# Patient Record
Sex: Male | Born: 1950 | Race: White | Hispanic: No | Marital: Single | Smoking: Former smoker
Health system: Southern US, Community
[De-identification: ages and names within clinical notes are randomized; demographics above are authoritative.]

## PROBLEM LIST (undated history)

## (undated) DIAGNOSIS — T8859XA Other complications of anesthesia, initial encounter: Secondary | ICD-10-CM

## (undated) DIAGNOSIS — I499 Cardiac arrhythmia, unspecified: Secondary | ICD-10-CM

## (undated) DIAGNOSIS — T4145XA Adverse effect of unspecified anesthetic, initial encounter: Secondary | ICD-10-CM

## (undated) DIAGNOSIS — J449 Chronic obstructive pulmonary disease, unspecified: Secondary | ICD-10-CM

## (undated) DIAGNOSIS — Z9889 Other specified postprocedural states: Secondary | ICD-10-CM

## (undated) DIAGNOSIS — M1990 Unspecified osteoarthritis, unspecified site: Secondary | ICD-10-CM

## (undated) DIAGNOSIS — R112 Nausea with vomiting, unspecified: Secondary | ICD-10-CM

## (undated) DIAGNOSIS — I10 Essential (primary) hypertension: Secondary | ICD-10-CM

## (undated) HISTORY — PX: FOOT SURGERY: SHX648

## (undated) HISTORY — PX: HERNIA REPAIR: SHX51

---
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
## 2018-03-09 ENCOUNTER — Other Ambulatory Visit: Payer: Self-pay | Admitting: Orthopaedic Surgery

## 2018-04-25 ENCOUNTER — Inpatient Hospital Stay: Admit: 2018-04-25 | Payer: Self-pay | Admitting: Orthopaedic Surgery

## 2018-04-25 SURGERY — ARTHROPLASTY, HIP, TOTAL, ANTERIOR APPROACH
Anesthesia: Spinal | Laterality: Right

## 2018-05-29 ENCOUNTER — Other Ambulatory Visit: Payer: Self-pay | Admitting: Orthopaedic Surgery

## 2018-06-06 NOTE — Patient Instructions (Signed)
Jason Mosley  06/06/2018   Your procedure is scheduled on: 06-13-18    Report to [facility] Main  Entrance    Report to Short Stay at 5:30 AM   YOU NEED TO HAVE A COVID 19 TEST ON 06-09-18, THIS TEST MUST BE DONE BEFORE SURGERY, COME TO [facility] ENTRANCE BETWEEN THE HOURS OF 900 AM AND 300 PM ON YOUR COVID TEST DATE.   Call this number if you have problems the morning of surgery [phone]    Remember: NO SOLID FOOD AFTER MIDNIGHT THE NIGHT PRIOR TO SURGERY. NOTHING BY MOUTH EXCEPT CLEAR LIQUIDS UNTIL 3 HOURS PRIOR TO SCHEDULED SURGERY. PLEASE FINISH ENSURE DRINK PER SURGEON ORDER 3 HOURS PRIOR TO SCHEDULED SURGERY TIME WHICH NEEDS TO BE COMPLETED AT 4:30 AM.   CLEAR LIQUID DIET   Foods Allowed                                                                     Foods Excluded  Coffee and tea, regular and decaf                             liquids that you cannot  Plain Jell-O in any flavor                                             see through such as: Fruit ices (not with fruit pulp)                                     milk, soups, orange juice  Iced Popsicles                                    All solid food Carbonated beverages, regular and diet                                    Cranberry, grape and apple juices Sports drinks like Gatorade Lightly seasoned clear broth or consume(fat free) Sugar, honey syrup  Sample Menu Breakfast                                Lunch                                     Supper Cranberry juice                    Beef broth                            Chicken broth Jell-O  Grape juice                           Apple juice Coffee or tea                        Jell-O                                      Popsicle                                                Coffee or tea                        Coffee or  tea  _____________________________________________________________________     BRUSH YOUR TEETH MORNING OF SURGERY AND RINSE YOUR MOUTH OUT, NO CHEWING GUM CANDY OR MINTS.     Take these medicines the morning of surgery with A SIP OF WATER: None                                  You may not have any metal on your body including hair pins and              piercings     Do not wear jewelry, make-up, lotions, powders or perfumes, deodorant                          Men may shave face and neck.   Do not bring valuables to the hospital. [place] IS NOT             RESPONSIBLE   FOR VALUABLES.  Contacts, dentures or bridgework may not be worn into surgery.    Special Instructions: N/A              Please read over the following fact sheets you were given: _____________________________________________________________________             [facility] - Preparing for [facility] Before surgery, you can play an important role.  Because skin is not sterile, your skin needs to be as free of germs as possible.  You can reduce the number of germs on your skin by washing with CHG (chlorahexidine gluconate) soap before surgery.  CHG is an antiseptic cleaner which kills germs and bonds with the skin to continue killing germs even after washing. Please DO NOT use if you have an allergy to CHG or antibacterial soaps.  If your skin becomes reddened/irritated stop using the CHG and inform your nurse when you arrive at Short Stay. Do not shave (including legs and underarms) for at least 48 hours prior to the first CHG shower.  You may shave your face/neck. Please follow these instructions carefully:  1.  Shower with CHG Soap the night before surgery and the  morning of Surgery.  2.  If you choose to wash your hair, wash your hair first as usual with your  normal  shampoo.  3.  After you shampoo, rinse your hair and body thoroughly to remove the  shampoo.  4.  Use CHG as you would  any other liquid soap.  You can apply chg directly  to the skin and wash                       Gently with a scrungie or clean washcloth.  5.  Apply the CHG Soap to your body ONLY FROM THE NECK DOWN.   Do not use on face/ open                           Wound or open sores. Avoid contact with eyes, ears mouth and genitals (private parts).                       Wash face,  Genitals (private parts) with your normal soap.             6.  Wash thoroughly, paying special attention to the area where your surgery  will be performed.  7.  Thoroughly rinse your body with warm water from the neck down.  8.  DO NOT shower/wash with your normal soap after using and rinsing off  the CHG Soap.                9.  Pat yourself dry with a clean towel.            10.  Wear clean pajamas.            11.  Place clean sheets on your bed the night of your first shower and do not  sleep with pets. Day of Surgery : Do not apply any lotions/deodorants the morning of surgery.  Please wear clean clothes to the hospital/surgery center.  FAILURE TO FOLLOW THESE INSTRUCTIONS MAY RESULT IN THE CANCELLATION OF YOUR SURGERY PATIENT SIGNATURE_________________________________  NURSE SIGNATURE__________________________________  ________________________________________________________________________   Adam Phenix  An incentive spirometer is a tool that can help keep your lungs clear and active. This tool measures how well you are filling your lungs with each breath. Taking long deep breaths may help reverse or decrease the chance of developing breathing (pulmonary) problems (especially infection) following:  A long period of time when you are unable to move or be active. BEFORE THE PROCEDURE   If the spirometer includes an indicator to show your best effort, your nurse or respiratory therapist will set it to a desired goal.  If possible, sit up straight or lean slightly forward. Try not to slouch.  Hold the  incentive spirometer in an upright position. INSTRUCTIONS FOR USE  1. Sit on the edge of your bed if possible, or sit up as far as you can in bed or on a chair. 2. Hold the incentive spirometer in an upright position. 3. Breathe out normally. 4. Place the mouthpiece in your mouth and seal your lips tightly around it. 5. Breathe in slowly and as deeply as possible, raising the piston or the ball toward the top of the column. 6. Hold your breath for 3-5 seconds or for as long as possible. Allow the piston or ball to fall to the bottom of the column. 7. Remove the mouthpiece from your mouth and breathe out normally. 8. Rest for a few seconds and repeat Steps 1 through 7 at least 10 times every 1-2 hours when you are awake. Take your time and take a few normal breaths between deep breaths. 9. The spirometer may include an indicator to  show your best effort. Use the indicator as a goal to work toward during each repetition. 10. After each set of 10 deep breaths, practice coughing to be sure your lungs are clear. If you have an incision (the cut made at the time of surgery), support your incision when coughing by placing a pillow or rolled up towels firmly against it. Once you are able to get out of bed, walk around indoors and cough well. You may stop using the incentive spirometer when instructed by your caregiver.  RISKS AND COMPLICATIONS  Take your time so you do not get dizzy or light-headed.  If you are in pain, you may need to take or ask for pain medication before doing incentive spirometry. It is harder to take a deep breath if you are having pain. AFTER USE  Rest and breathe slowly and easily.  It can be helpful to keep track of a log of your progress. Your caregiver can provide you with a simple table to help with this. If you are using the spirometer at home, follow these instructions: [place] IF:   You are having difficultly using the spirometer.  You have trouble using  the spirometer as often as instructed.  Your pain medication is not giving enough relief while using the spirometer.  You develop fever of 100.5 F (38.1 C) or higher. SEEK IMMEDIATE MEDICAL CARE IF:   You cough up bloody sputum that had not been present before.  You develop fever of 102 F (38.9 C) or greater.  You develop worsening pain at or near the incision site. MAKE SURE YOU:   Understand these instructions.  Will watch your condition.  Will get help right away if you are not doing well or get worse. Document Released: 05/10/2006 Document Revised: 03/22/2011 Document Reviewed: 07/11/2006 Exit[employer] Patient Information 2014 ExitCare, [place].   ________________________________________________________________________  WHAT IS A BLOOD TRANSFUSION? Blood Transfusion Information  A transfusion is the replacement of blood or some of its parts. Blood is made up of multiple cells which provide different functions.  Red blood cells carry oxygen and are used for blood loss replacement.  White blood cells fight against infection.  Platelets control bleeding.  Plasma helps clot blood.  Other blood products are available for specialized needs, such as hemophilia or other clotting disorders. BEFORE THE TRANSFUSION  Who gives blood for transfusions?   Healthy volunteers who are fully evaluated to make sure their blood is safe. This is blood bank blood. Transfusion therapy is the safest it has ever been in the practice of medicine. Before blood is taken from a donor, a complete history is taken to make sure that person has no history of diseases nor engages in risky social behavior (examples are intravenous drug use or sexual activity with multiple partners). The donor's travel history is screened to minimize risk of transmitting infections, such as malaria. The donated blood is tested for signs of infectious diseases, such as HIV and hepatitis. The blood is then tested to be sure it  is compatible with you in order to minimize the chance of a transfusion reaction. If you or a relative donates blood, this is often done in anticipation of surgery and is not appropriate for emergency situations. It takes many days to process the donated blood. RISKS AND COMPLICATIONS Although transfusion therapy is very safe and saves many lives, the main dangers of transfusion include:   Getting an infectious disease.  Developing a transfusion reaction. This is an allergic reaction  to something in the blood you were given. Every precaution is taken to prevent this. The decision to have a blood transfusion has been considered carefully by your caregiver before blood is given. Blood is not given unless the benefits outweigh the risks. AFTER THE TRANSFUSION  Right after receiving a blood transfusion, you will usually feel much better and more energetic. This is especially true if your red blood cells have gotten low (anemic). The transfusion raises the level of the red blood cells which carry oxygen, and this usually causes an energy increase.  The nurse administering the transfusion will monitor you carefully for complications. HOME CARE INSTRUCTIONS  No special instructions are needed after a transfusion. You may find your energy is better. Speak with your caregiver about any limitations on activity for underlying diseases you may have. SEEK MEDICAL CARE IF:   Your condition is not improving after your transfusion.  You develop redness or irritation at the intravenous (IV) site. SEEK IMMEDIATE MEDICAL CARE IF:  Any of the following symptoms occur over the next 12 hours:  Shaking chills.  You have a temperature by mouth above 102 F (38.9 C), not controlled by medicine.  Chest, back, or muscle pain.  People around you feel you are not acting correctly or are confused.  Shortness of breath or difficulty breathing.  Dizziness and fainting.  You get a rash or develop hives.  You  have a decrease in urine output.  Your urine turns a dark color or changes to pink, red, or brown. Any of the following symptoms occur over the next 10 days:  You have a temperature by mouth above 102 F (38.9 C), not controlled by medicine.  Shortness of breath.  Weakness after normal activity.  The white part of the eye turns yellow (jaundice).  You have a decrease in the amount of urine or are urinating less often.  Your urine turns a dark color or changes to pink, red, or brown. Document Released: 12/26/1999 Document Revised: 03/22/2011 Document Reviewed: 08/14/2007 [facility] Patient Information 2014 [place], [place].  _______________________________________________________________________

## 2018-06-07 ENCOUNTER — Other Ambulatory Visit: Payer: Self-pay

## 2018-06-07 ENCOUNTER — Other Ambulatory Visit: Payer: Self-pay | Admitting: Orthopaedic Surgery

## 2018-06-07 ENCOUNTER — Encounter (HOSPITAL_COMMUNITY): Payer: Self-pay

## 2018-06-07 ENCOUNTER — Encounter (HOSPITAL_COMMUNITY)
Admission: RE | Admit: 2018-06-07 | Discharge: 2018-06-07 | Disposition: A | Discharge status: Home / Self Care | Payer: Medicare Other | Source: Ambulatory Visit | Attending: Orthopaedic Surgery | Admitting: Orthopaedic Surgery

## 2018-06-07 ENCOUNTER — Ambulatory Visit (HOSPITAL_COMMUNITY)
Admission: RE | Admit: 2018-06-07 | Discharge: 2018-06-07 | Disposition: A | Discharge status: Home / Self Care | Payer: Medicare Other | Source: Ambulatory Visit | Attending: Orthopaedic Surgery | Admitting: Orthopaedic Surgery

## 2018-06-07 DIAGNOSIS — Z01818 Encounter for other preprocedural examination: Secondary | ICD-10-CM | POA: Insufficient documentation

## 2018-06-07 HISTORY — DX: Chronic obstructive pulmonary disease, unspecified: J44.9

## 2018-06-07 HISTORY — DX: Nausea with vomiting, unspecified: R11.2

## 2018-06-07 HISTORY — DX: Other specified postprocedural states: Z98.890

## 2018-06-07 HISTORY — DX: Essential (primary) hypertension: I10

## 2018-06-07 HISTORY — DX: Other complications of anesthesia, initial encounter: T88.59XA

## 2018-06-07 HISTORY — DX: Unspecified osteoarthritis, unspecified site: M19.90

## 2018-06-07 LAB — URINALYSIS, ROUTINE W REFLEX MICROSCOPIC
Bilirubin Urine: NEGATIVE
Glucose, UA: NEGATIVE mg/dL
Hgb urine dipstick: NEGATIVE
Ketones, ur: NEGATIVE mg/dL
Nitrite: NEGATIVE
Protein, ur: NEGATIVE mg/dL
Specific Gravity, Urine: 1.009 (ref 1.005–1.030)
pH: 7 (ref 5.0–8.0)

## 2018-06-07 LAB — BASIC METABOLIC PANEL
Anion gap: 10 (ref 5–15)
BUN: 16 mg/dL (ref 8–23)
CO2: 26 mmol/L (ref 22–32)
Calcium: 9.4 mg/dL (ref 8.9–10.3)
Chloride: 100 mmol/L (ref 98–111)
Creatinine, Ser: 1.08 mg/dL (ref 0.61–1.24)
GFR calc Af Amer: >60 mL/min (ref >60)
GFR calc non Af Amer: >60 mL/min (ref >60)
Glucose, Bld: 102 mg/dL — ABNORMAL HIGH (ref 70–99)
Potassium: 4.4 mmol/L (ref 3.5–5.1)
Sodium: 136 mmol/L (ref 135–145)

## 2018-06-07 LAB — CBC WITH DIFFERENTIAL/PLATELET
Abs Immature Granulocytes: 0.02 10*3/uL (ref 0.00–0.07)
Basophils Absolute: 0 10*3/uL (ref 0.0–0.1)
Basophils Relative: 1 %
Eosinophils Absolute: 0.1 10*3/uL (ref 0.0–0.5)
Eosinophils Relative: 2 %
HCT: 44.2 % (ref 39.0–52.0)
Hemoglobin: 14.7 g/dL (ref 13.0–17.0)
Immature Granulocytes: 0 %
Lymphocytes Relative: 25 %
Lymphs Abs: 1.5 10*3/uL (ref 0.7–4.0)
MCH: 32.3 pg (ref 26.0–34.0)
MCHC: 33.3 g/dL (ref 30.0–36.0)
MCV: 97.1 fL (ref 80.0–100.0)
Monocytes Absolute: 0.5 10*3/uL (ref 0.1–1.0)
Monocytes Relative: 8 %
Neutro Abs: 3.9 10*3/uL (ref 1.7–7.7)
Neutrophils Relative %: 64 %
Platelets: 239 10*3/uL (ref 150–400)
RBC: 4.55 MIL/uL (ref 4.22–5.81)
RDW: 12.2 % (ref 11.5–15.5)
WBC: 6.1 10*3/uL (ref 4.0–10.5)
nRBC: 0 % (ref 0.0–0.2)

## 2018-06-07 LAB — PROTIME-INR
INR: 1 (ref 0.8–1.2)
Prothrombin Time: 13.3 seconds (ref 11.4–15.2)

## 2018-06-07 LAB — ABO/RH: ABO/RH(D): O POS

## 2018-06-07 LAB — APTT: aPTT: 28 seconds (ref 24–36)

## 2018-06-07 NOTE — Care Plan (Signed)
Spoke with patient prior to surgery. He plans to discharge to home with friend to assist. Rolling walker has been ordered for him for delivery to hospital room prior to discharge. He will go to [facility] in [place] for therapy . Patient and MD in agreement with plan. Choice offered.    Shauna Hugh, RNCM  [phone]

## 2018-06-09 ENCOUNTER — Other Ambulatory Visit (HOSPITAL_COMMUNITY)
Admission: RE | Admit: 2018-06-09 | Discharge: 2018-06-09 | Disposition: A | Discharge status: Home / Self Care | Payer: Medicare Other | Source: Ambulatory Visit | Attending: Orthopaedic Surgery | Admitting: Orthopaedic Surgery

## 2018-06-09 DIAGNOSIS — Z1159 Encounter for screening for other viral diseases: Secondary | ICD-10-CM | POA: Insufficient documentation

## 2018-06-10 LAB — NOVEL CORONAVIRUS, NAA (HOSP ORDER, SEND-OUT TO REF LAB; TAT 18-24 HRS): SARS-CoV-2, NAA: NOT DETECTED

## 2018-06-12 MED ORDER — BUPIVACAINE LIPOSOME 1.3 % IJ SUSP
10.0000 mL | Freq: Once | INTRAMUSCULAR | Status: DC
  Filled 2018-06-12: qty 10

## 2018-06-12 MED ORDER — TRANEXAMIC ACID 1000 MG/10ML IV SOLN
2000.0000 mg | INTRAVENOUS | Status: DC
  Filled 2018-06-12: qty 20

## 2018-06-12 NOTE — H&P (Signed)
TOTAL HIP ADMISSION H&P  Patient is admitted for right total hip arthroplasty.  Subjective:  Chief Complaint: right hip pain  HPI: Jason Mosley, 68 y.o. male, has a history of pain and functional disability in the right hip(s) due to arthritis and patient has failed non-surgical conservative treatments for greater than 12 weeks to include NSAID's and/or analgesics, flexibility and strengthening excercises, supervised PT with diminished ADL's post treatment, use of assistive devices, weight reduction as appropriate and activity modification.  Onset of symptoms was gradual starting 5 years ago with gradually worsening course since that time.The patient noted no past surgery on the right hip(s).  Patient currently rates pain in the right hip at 10 out of 10 with activity. Patient has night pain, worsening of pain with activity and weight bearing, trendelenberg gait, pain that interfers with activities of daily living and crepitus. Patient has evidence of subchondral cysts, subchondral sclerosis, periarticular osteophytes and joint space narrowing by imaging studies. This condition presents safety issues increasing the risk of falls. There is no current active infection.  There are no active problems to display for this patient.  Past Medical History:  Diagnosis Date  . Arthritis   . Complication of anesthesia   . COPD (chronic obstructive pulmonary disease) (HCC)    Diagnosised x 20 years ago. No complications since diagnosed  . Hypertension   . PONV (postoperative nausea and vomiting)     Past Surgical History:  Procedure Laterality Date  . FOOT SURGERY    . HERNIA REPAIR     Inguinal    Current Facility-Administered Medications  Medication Dose Route Frequency Provider Last Rate Last Dose  . [START ON 06/13/2018] bupivacaine liposome (EXPAREL) 1.3 % injection 133 mg  10 mL Infiltration Once Marcene Corningalldorf, Peter, MD      . Melene Muller[START ON 06/13/2018] tranexamic acid (CYKLOKAPRON) 2,000 mg in sodium  chloride 0.9 % 50 mL Topical Application  2,000 mg Topical To OR Marcene Corningalldorf, Peter, MD       Current Outpatient Medications  Medication Sig Dispense Refill Last Dose  . acetaminophen (TYLENOL) 650 MG CR tablet Take 1,300 mg by mouth every 8 (eight) hours as needed for pain.     Marland Kitchen. atorvastatin (LIPITOR) 10 MG tablet Take 10 mg by mouth every evening.     . Cyanocobalamin (B-12 PO) Take 1 tablet by mouth daily.     . diclofenac sodium (VOLTAREN) 1 % GEL Apply 1 application topically 3 (three) times daily.     . diphenhydrAMINE (BENADRYL) 25 MG tablet Take 25 mg by mouth at bedtime as needed for sleep.     . finasteride (PROSCAR) 5 MG tablet Take 5 mg by mouth every evening.     Marland Kitchen. lisinopril (ZESTRIL) 10 MG tablet Take 10 mg by mouth every evening.     . Multiple Vitamin (MULTIVITAMIN WITH MINERALS) TABS tablet Take 1 tablet by mouth daily.     . tamsulosin (FLOMAX) 0.4 MG CAPS capsule Take 0.4 mg by mouth every evening.      Allergies  Allergen Reactions  . Penicillins Itching    Did it involve swelling of the face/tongue/throat, SOB, or low BP? No Did it involve sudden or severe rash/hives, skin peeling, or any reaction on the inside of your mouth or nose? No Did you need to seek medical attention at a hospital or doctor's office? No When did it last happen?10 years ago If all above answers are "NO", may proceed with cephalosporin use.     Social  History   Tobacco Use  . Smoking status: Former Smoker    Packs/day: 3.00    Years: 50.00    Pack years: 150.00    Types: Cigarettes  . Smokeless tobacco: Never Used  . Tobacco comment: closet smoker  Substance Use Topics  . Alcohol use: Yes    Alcohol/week: 6.0 standard drinks    Types: 6 Glasses of wine per week    Comment: Fridays /Saturday weekly    No family history on file.   Review of Systems  Musculoskeletal: Positive for joint pain.       Right hip  All other systems reviewed and are  negative.   Objective:  Physical Exam  Constitutional: He is oriented to person, place, and time. He appears well-developed and well-nourished.  HENT:  Head: Normocephalic and atraumatic.  Eyes: Pupils are equal, round, and reactive to light.  Neck: Normal range of motion.  Cardiovascular: Normal rate and regular rhythm.  Respiratory: Effort normal.  GI: Soft.  Musculoskeletal:     Comments: He walks with a very antalgic gait.  Right hip motion is extremely limited and very painful.  Straight leg raise is negative.  Motion of both knees is about 0-120.  There is crepitation and trace effusion on the left.  Sensation and motor function are intact in his feet with palpable pulses on both sides.    Neurological: He is alert and oriented to person, place, and time.  Skin: Skin is warm and dry.  Psychiatric: He has a normal mood and affect. His behavior is normal. Judgment and thought content normal.    Vital signs in last 24 hours:    Labs:   Estimated body mass index is 29.91 kg/m as calculated from the following:   Height as of 06/07/18: 5\' 10"  (1.778 m).   Weight as of 06/07/18: 94.5 kg.   Imaging Review Plain radiographs demonstrate severe degenerative joint disease of the right hip(s). The bone quality appears to be good for age and reported activity level.      Assessment/Plan:  End stage primary arthritis, right hip(s)  The patient history, physical examination, clinical judgement of the provider and imaging studies are consistent with end stage degenerative joint disease of the right hip(s) and total hip arthroplasty is deemed medically necessary. The treatment options including medical management, injection therapy, arthroscopy and arthroplasty were discussed at length. The risks and benefits of total hip arthroplasty were presented and reviewed. The risks due to aseptic loosening, infection, stiffness, dislocation/subluxation,  thromboembolic complications and other  imponderables were discussed.  The patient acknowledged the explanation, agreed to proceed with the plan and consent was signed. Patient is being admitted for inpatient treatment for surgery, pain control, PT, OT, prophylactic antibiotics, VTE prophylaxis, progressive ambulation and ADL's and discharge planning.The patient is planning to be discharged home with home health services

## 2018-06-12 NOTE — Anesthesia Preprocedure Evaluation (Addendum)
Anesthesia Evaluation  Patient identified by MRN, date of birth, ID band Patient awake    Reviewed: Allergy & Precautions, NPO status , Patient's Chart, lab work & pertinent test results  History of Anesthesia Complications (+) PONV and history of anesthetic complications  Airway Mallampati: II  TM Distance: >3 FB Neck ROM: Full    Dental  (+) Dental Advisory Given, Teeth Intact   Pulmonary sleep apnea , COPD, former smoker,    breath sounds clear to auscultation       Cardiovascular hypertension, Pt. on medications (-) angina Rhythm:Regular Rate:Normal     Neuro/Psych negative neurological ROS  negative psych ROS   GI/Hepatic negative GI ROS, Neg liver ROS,   Endo/Other  negative endocrine ROS  Renal/GU negative Renal ROS     Musculoskeletal  (+) Arthritis ,   Abdominal   Peds  Hematology negative hematology ROS (+)   Anesthesia Other Findings   Reproductive/Obstetrics                            Anesthesia Physical Anesthesia Plan  ASA: II  Anesthesia Plan: Spinal   Post-op Pain Management:  [facility] for Post-op pain   Induction:   PONV Risk Score and Plan: 2 and Treatment may vary due to age or medical condition and Propofol infusion  Airway Management Planned: Natural Airway and Simple Face Mask  Additional Equipment: None  Intra-op Plan:   Post-operative Plan:   Informed Consent: I have reviewed the patients History and Physical, chart, labs and discussed the procedure including the risks, benefits and alternatives for the proposed anesthesia with the patient or authorized representative who has indicated his/her understanding and acceptance.       Plan Discussed with: CRNA and Anesthesiologist  Anesthesia Plan Comments: (Labs reviewed, platelets acceptable. Discussed risks and benefits of spinal, including spinal/epidural hematoma, infection, failed block, and  PDPH. Patient expressed understanding and wished to proceed. )       Anesthesia Quick Evaluation

## 2018-06-12 NOTE — Progress Notes (Signed)
SPOKE W/ patient via phone.      SCREENING SYMPTOMS OF COVID 19:     COUGH--no   RUNNY NOSE---no    SORE THROAT---no   NASAL CONGESTION----no   SNEEZING---no   SHORTNESS OF BREATH---no   DIFFICULTY BREATHING---no   TEMP >100.0 -----no   UNEXPLAINED BODY ACHES------no   CHILLS --------no    HEADACHES ---------no   LOSS OF SMELL/ TASTE --------no       HAVE YOU OR ANY FAMILY MEMBER TRAVELLED PAST 14 DAYS OUT OF THE    COUNTY---no STATE----no COUNTRY----no   HAVE YOU OR ANY FAMILY MEMBER BEEN EXPOSED TO ANYONE WITH COVID 19?    no 

## 2018-06-13 ENCOUNTER — Other Ambulatory Visit: Payer: Self-pay

## 2018-06-13 ENCOUNTER — Inpatient Hospital Stay (HOSPITAL_COMMUNITY): Payer: Medicare Other

## 2018-06-13 ENCOUNTER — Inpatient Hospital Stay (HOSPITAL_COMMUNITY): Payer: Medicare Other | Admitting: Anesthesiology

## 2018-06-13 ENCOUNTER — Encounter (HOSPITAL_COMMUNITY)
Admission: RE | Disposition: A | Discharge status: Home / Self Care | Payer: Self-pay | Source: Home / Self Care | Attending: Orthopaedic Surgery

## 2018-06-13 ENCOUNTER — Observation Stay (HOSPITAL_COMMUNITY)
Admission: RE | Admit: 2018-06-13 | Discharge: 2018-06-14 | Disposition: A | Discharge status: Home / Self Care | Payer: Medicare Other | Attending: Orthopaedic Surgery | Admitting: Orthopaedic Surgery

## 2018-06-13 ENCOUNTER — Inpatient Hospital Stay (HOSPITAL_COMMUNITY): Payer: Medicare Other | Admitting: Physician Assistant

## 2018-06-13 ENCOUNTER — Encounter (HOSPITAL_COMMUNITY): Payer: Self-pay | Admitting: *Deleted

## 2018-06-13 DIAGNOSIS — J449 Chronic obstructive pulmonary disease, unspecified: Secondary | ICD-10-CM | POA: Diagnosis not present

## 2018-06-13 DIAGNOSIS — Z79899 Other long term (current) drug therapy: Secondary | ICD-10-CM | POA: Diagnosis not present

## 2018-06-13 DIAGNOSIS — I10 Essential (primary) hypertension: Secondary | ICD-10-CM | POA: Diagnosis not present

## 2018-06-13 DIAGNOSIS — Z419 Encounter for procedure for purposes other than remedying health state, unspecified: Secondary | ICD-10-CM

## 2018-06-13 DIAGNOSIS — M1611 Unilateral primary osteoarthritis, right hip: Secondary | ICD-10-CM | POA: Diagnosis present

## 2018-06-13 DIAGNOSIS — Z87891 Personal history of nicotine dependence: Secondary | ICD-10-CM | POA: Insufficient documentation

## 2018-06-13 DIAGNOSIS — Z791 Long term (current) use of non-steroidal anti-inflammatories (NSAID): Secondary | ICD-10-CM | POA: Insufficient documentation

## 2018-06-13 DIAGNOSIS — G4730 Sleep apnea, unspecified: Secondary | ICD-10-CM | POA: Diagnosis not present

## 2018-06-13 HISTORY — PX: TOTAL HIP ARTHROPLASTY: SHX124

## 2018-06-13 LAB — TYPE AND SCREEN
ABO/RH(D): O POS
Antibody Screen: NEGATIVE

## 2018-06-13 SURGERY — ARTHROPLASTY, HIP, TOTAL, ANTERIOR APPROACH
Anesthesia: Spinal | Laterality: Right

## 2018-06-13 MED ORDER — LACTATED RINGERS IV SOLN
INTRAVENOUS | Status: DC
  Administered 2018-06-13: 07:00:00 via INTRAVENOUS

## 2018-06-13 MED ORDER — SODIUM CHLORIDE 0.9 % IV SOLN
INTRAVENOUS | Status: DC | PRN
  Administered 2018-06-13: 08:00:00 50 ug/min via INTRAVENOUS

## 2018-06-13 MED ORDER — ONDANSETRON HCL 4 MG/2ML IJ SOLN: 4.0000 mg | Freq: Four times a day (QID) | INTRAMUSCULAR | Status: DC | PRN

## 2018-06-13 MED ORDER — LACTATED RINGERS IV SOLN
INTRAVENOUS | Status: DC
  Administered 2018-06-13: 20:00:00 via INTRAVENOUS

## 2018-06-13 MED ORDER — PROPOFOL 10 MG/ML IV BOLUS
INTRAVENOUS | Status: AC
  Filled 2018-06-13: qty 20

## 2018-06-13 MED ORDER — ACETAMINOPHEN 500 MG PO TABS
500.0000 mg | ORAL_TABLET | Freq: Four times a day (QID) | ORAL | Status: AC
  Administered 2018-06-13 – 2018-06-14 (×4): 500 mg via ORAL
  Filled 2018-06-13 (×4): qty 1

## 2018-06-13 MED ORDER — METOCLOPRAMIDE HCL 5 MG/ML IJ SOLN: 5.0000 mg | Freq: Three times a day (TID) | INTRAMUSCULAR | Status: DC | PRN

## 2018-06-13 MED ORDER — FENTANYL CITRATE (PF) 100 MCG/2ML IJ SOLN: 25.0000 ug | INTRAMUSCULAR | Status: DC | PRN

## 2018-06-13 MED ORDER — PHENYLEPHRINE 40 MCG/ML (10ML) SYRINGE FOR IV PUSH (FOR BLOOD PRESSURE SUPPORT)
PREFILLED_SYRINGE | INTRAVENOUS | Status: DC | PRN
  Administered 2018-06-13 (×3): 80 ug via INTRAVENOUS

## 2018-06-13 MED ORDER — MIDAZOLAM HCL 2 MG/2ML IJ SOLN
INTRAMUSCULAR | Status: AC
  Filled 2018-06-13: qty 2

## 2018-06-13 MED ORDER — PHENOL 1.4 % MT LIQD
1.0000 | OROMUCOSAL | Status: DC | PRN
  Filled 2018-06-13: qty 177

## 2018-06-13 MED ORDER — DIPHENHYDRAMINE HCL 25 MG PO CAPS
25.0000 mg | ORAL_CAPSULE | Freq: Every evening | ORAL | Status: DC | PRN
  Filled 2018-06-13: qty 1

## 2018-06-13 MED ORDER — TRANEXAMIC ACID 1000 MG/10ML IV SOLN
INTRAVENOUS | Status: DC | PRN
  Administered 2018-06-13: 2000 mg via TOPICAL

## 2018-06-13 MED ORDER — CEFAZOLIN SODIUM-DEXTROSE 2-4 GM/100ML-% IV SOLN
2.0000 g | Freq: Four times a day (QID) | INTRAVENOUS | Status: AC
  Administered 2018-06-13 (×2): 2 g via INTRAVENOUS
  Filled 2018-06-13 (×3): qty 100

## 2018-06-13 MED ORDER — ONDANSETRON HCL 4 MG/2ML IJ SOLN
INTRAMUSCULAR | Status: AC
  Filled 2018-06-13: qty 2

## 2018-06-13 MED ORDER — KETOROLAC TROMETHAMINE 15 MG/ML IJ SOLN
7.5000 mg | Freq: Four times a day (QID) | INTRAMUSCULAR | Status: AC
  Administered 2018-06-13 – 2018-06-14 (×4): 7.5 mg via INTRAVENOUS
  Filled 2018-06-13 (×5): qty 1

## 2018-06-13 MED ORDER — HYDROCODONE-ACETAMINOPHEN 5-325 MG PO TABS
1.0000 | ORAL_TABLET | ORAL | Status: DC | PRN
  Administered 2018-06-13 – 2018-06-14 (×3): 1 via ORAL
  Filled 2018-06-13 (×5): qty 1

## 2018-06-13 MED ORDER — OXYCODONE HCL 5 MG PO TABS: 5.0000 mg | ORAL_TABLET | Freq: Once | ORAL | Status: DC | PRN

## 2018-06-13 MED ORDER — BUPIVACAINE LIPOSOME 1.3 % IJ SUSP
INTRAMUSCULAR | Status: DC | PRN
  Administered 2018-06-13: 10 mL

## 2018-06-13 MED ORDER — MENTHOL 3 MG MT LOZG: 1.0000 | LOZENGE | OROMUCOSAL | Status: DC | PRN

## 2018-06-13 MED ORDER — METOCLOPRAMIDE HCL 5 MG PO TABS: 5.0000 mg | ORAL_TABLET | Freq: Three times a day (TID) | ORAL | Status: DC | PRN

## 2018-06-13 MED ORDER — DEXAMETHASONE SODIUM PHOSPHATE 10 MG/ML IJ SOLN
INTRAMUSCULAR | Status: AC
  Filled 2018-06-13: qty 1

## 2018-06-13 MED ORDER — ALUM & MAG HYDROXIDE-SIMETH 200-200-20 MG/5ML PO SUSP: 30.0000 mL | ORAL | Status: DC | PRN

## 2018-06-13 MED ORDER — METHOCARBAMOL 500 MG PO TABS
500.0000 mg | ORAL_TABLET | Freq: Four times a day (QID) | ORAL | Status: DC | PRN
  Administered 2018-06-14 (×2): 500 mg via ORAL
  Filled 2018-06-13 (×2): qty 1

## 2018-06-13 MED ORDER — ASPIRIN EC 325 MG PO TBEC
325.0000 mg | DELAYED_RELEASE_TABLET | Freq: Two times a day (BID) | ORAL | Status: DC
  Administered 2018-06-14: 325 mg via ORAL
  Filled 2018-06-13: qty 1

## 2018-06-13 MED ORDER — TRANEXAMIC ACID-NACL 1000-0.7 MG/100ML-% IV SOLN
1000.0000 mg | Freq: Once | INTRAVENOUS | Status: AC
  Administered 2018-06-13: 1000 mg via INTRAVENOUS
  Filled 2018-06-13: qty 100

## 2018-06-13 MED ORDER — METHOCARBAMOL 500 MG IVPB - SIMPLE MED
500.0000 mg | Freq: Four times a day (QID) | INTRAVENOUS | Status: DC | PRN
  Filled 2018-06-13: qty 50

## 2018-06-13 MED ORDER — ONDANSETRON HCL 4 MG/2ML IJ SOLN
INTRAMUSCULAR | Status: DC | PRN
  Administered 2018-06-13: 4 mg via INTRAVENOUS

## 2018-06-13 MED ORDER — BUPIVACAINE-EPINEPHRINE (PF) 0.25% -1:200000 IJ SOLN
INTRAMUSCULAR | Status: AC
  Filled 2018-06-13: qty 30

## 2018-06-13 MED ORDER — ONDANSETRON HCL 4 MG/2ML IJ SOLN: 4.0000 mg | Freq: Once | INTRAMUSCULAR | Status: DC | PRN

## 2018-06-13 MED ORDER — MORPHINE SULFATE (PF) 2 MG/ML IV SOLN: 0.5000 mg | INTRAVENOUS | Status: DC | PRN

## 2018-06-13 MED ORDER — PROPOFOL 10 MG/ML IV BOLUS
INTRAVENOUS | Status: AC
  Filled 2018-06-13: qty 60

## 2018-06-13 MED ORDER — POVIDONE-IODINE 10 % EX SWAB
2.0000 "application " | Freq: Once | CUTANEOUS | Status: AC
  Administered 2018-06-13: 2 via TOPICAL

## 2018-06-13 MED ORDER — PROPOFOL 10 MG/ML IV BOLUS
INTRAVENOUS | Status: DC | PRN
  Administered 2018-06-13: 40 mg via INTRAVENOUS

## 2018-06-13 MED ORDER — ACETAMINOPHEN 325 MG PO TABS: 325.0000 mg | ORAL_TABLET | Freq: Four times a day (QID) | ORAL | Status: DC | PRN

## 2018-06-13 MED ORDER — STERILE WATER FOR IRRIGATION IR SOLN
Status: DC | PRN
  Administered 2018-06-13: 2000 mL

## 2018-06-13 MED ORDER — OXYCODONE HCL 5 MG/5ML PO SOLN: 5.0000 mg | Freq: Once | ORAL | Status: DC | PRN

## 2018-06-13 MED ORDER — DOCUSATE SODIUM 100 MG PO CAPS
100.0000 mg | ORAL_CAPSULE | Freq: Two times a day (BID) | ORAL | Status: DC
  Administered 2018-06-13 – 2018-06-14 (×3): 100 mg via ORAL
  Filled 2018-06-13 (×3): qty 1

## 2018-06-13 MED ORDER — BUPIVACAINE-EPINEPHRINE (PF) 0.25% -1:200000 IJ SOLN
INTRAMUSCULAR | Status: DC | PRN
  Administered 2018-06-13: 30 mL

## 2018-06-13 MED ORDER — FINASTERIDE 5 MG PO TABS
5.0000 mg | ORAL_TABLET | Freq: Every evening | ORAL | Status: DC
  Administered 2018-06-13: 5 mg via ORAL
  Filled 2018-06-13: qty 1

## 2018-06-13 MED ORDER — LISINOPRIL 10 MG PO TABS
10.0000 mg | ORAL_TABLET | Freq: Every evening | ORAL | Status: DC
  Filled 2018-06-13: qty 1

## 2018-06-13 MED ORDER — BISACODYL 5 MG PO TBEC: 5.0000 mg | DELAYED_RELEASE_TABLET | Freq: Every day | ORAL | Status: DC | PRN

## 2018-06-13 MED ORDER — TRANEXAMIC ACID-NACL 1000-0.7 MG/100ML-% IV SOLN
1000.0000 mg | INTRAVENOUS | Status: AC
  Administered 2018-06-13: 08:00:00 1000 mg via INTRAVENOUS
  Filled 2018-06-13: qty 100

## 2018-06-13 MED ORDER — CEFAZOLIN SODIUM-DEXTROSE 2-4 GM/100ML-% IV SOLN
2.0000 g | INTRAVENOUS | Status: AC
  Administered 2018-06-13: 2 g via INTRAVENOUS
  Filled 2018-06-13: qty 100

## 2018-06-13 MED ORDER — DIPHENHYDRAMINE HCL 12.5 MG/5ML PO ELIX: 12.5000 mg | ORAL_SOLUTION | ORAL | Status: DC | PRN

## 2018-06-13 MED ORDER — CHLORHEXIDINE GLUCONATE 4 % EX LIQD: 60.0000 mL | Freq: Once | CUTANEOUS | Status: DC

## 2018-06-13 MED ORDER — TAMSULOSIN HCL 0.4 MG PO CAPS
0.4000 mg | ORAL_CAPSULE | Freq: Every evening | ORAL | Status: DC
  Administered 2018-06-13: 0.4 mg via ORAL
  Filled 2018-06-13: qty 1

## 2018-06-13 MED ORDER — LACTATED RINGERS IV SOLN
INTRAVENOUS | Status: DC
  Administered 2018-06-13: 06:00:00 via INTRAVENOUS

## 2018-06-13 MED ORDER — ATORVASTATIN CALCIUM 10 MG PO TABS
10.0000 mg | ORAL_TABLET | Freq: Every evening | ORAL | Status: DC
  Administered 2018-06-13: 10 mg via ORAL
  Filled 2018-06-13: qty 1

## 2018-06-13 MED ORDER — ONDANSETRON HCL 4 MG PO TABS
4.0000 mg | ORAL_TABLET | Freq: Four times a day (QID) | ORAL | Status: DC | PRN
  Administered 2018-06-14: 4 mg via ORAL
  Filled 2018-06-13: qty 1

## 2018-06-13 MED ORDER — HYDROCODONE-ACETAMINOPHEN 7.5-325 MG PO TABS
1.0000 | ORAL_TABLET | ORAL | Status: DC | PRN
  Administered 2018-06-14: 1 via ORAL
  Filled 2018-06-13: qty 1

## 2018-06-13 MED ORDER — DEXAMETHASONE SODIUM PHOSPHATE 10 MG/ML IJ SOLN
INTRAMUSCULAR | Status: DC | PRN
  Administered 2018-06-13: 10 mg via INTRAVENOUS

## 2018-06-13 MED ORDER — PHENYLEPHRINE 40 MCG/ML (10ML) SYRINGE FOR IV PUSH (FOR BLOOD PRESSURE SUPPORT)
PREFILLED_SYRINGE | INTRAVENOUS | Status: AC
  Filled 2018-06-13: qty 10

## 2018-06-13 MED ORDER — 0.9 % SODIUM CHLORIDE (POUR BTL) OPTIME
TOPICAL | Status: DC | PRN
  Administered 2018-06-13: 1000 mL

## 2018-06-13 MED ORDER — PROPOFOL 500 MG/50ML IV EMUL
INTRAVENOUS | Status: DC | PRN
  Administered 2018-06-13: 100 ug/kg/min via INTRAVENOUS

## 2018-06-13 MED ORDER — BUPIVACAINE IN DEXTROSE 0.75-8.25 % IT SOLN
INTRATHECAL | Status: DC | PRN
  Administered 2018-06-13: 1.6 mL via INTRATHECAL

## 2018-06-13 SURGICAL SUPPLY — 43 items
BAG DECANTER FOR FLEXI CONT (MISCELLANEOUS) ×3 IMPLANT
BLADE SAW SGTL 18X1.27X75 (BLADE) ×2 IMPLANT
BLADE SAW SGTL 18X1.27X75MM (BLADE) ×1
BLADE SURG SZ10 CARB STEEL (BLADE) ×6 IMPLANT
CELLS DAT CNTRL 66122 CELL SVR (MISCELLANEOUS) ×1 IMPLANT
COVER PERINEAL POST (MISCELLANEOUS) ×3 IMPLANT
COVER SURGICAL LIGHT HANDLE (MISCELLANEOUS) ×3 IMPLANT
COVER WAND RF STERILE (DRAPES) IMPLANT
CUP ACET GRIPTION SERIS 56 100 (Trauma) ×1 IMPLANT
DECANTER SPIKE VIAL GLASS SM (MISCELLANEOUS) ×3 IMPLANT
DRAPE IMP U-DRAPE 54X76 (DRAPES) ×3 IMPLANT
DRAPE STERI IOBAN 125X83 (DRAPES) ×3 IMPLANT
DRAPE U-SHAPE 47X51 STRL (DRAPES) ×6 IMPLANT
DRSG AQUACEL AG ADV 3.5X10 (GAUZE/BANDAGES/DRESSINGS) ×3 IMPLANT
DURAPREP 26ML APPLICATOR (WOUND CARE) ×3 IMPLANT
ELECT BLADE TIP CTD 4 INCH (ELECTRODE) ×3 IMPLANT
ELECT REM PT RETURN 15FT ADLT (MISCELLANEOUS) ×3 IMPLANT
ELIMINATOR HOLE APEX DEPUY (Hips) ×3 IMPLANT
GIPTION SERIES 56 100 (Trauma) ×3 IMPLANT
GLOVE BIO SURGEON STRL SZ8 (GLOVE) ×6 IMPLANT
GLOVE BIOGEL PI IND STRL 8 (GLOVE) ×2 IMPLANT
GLOVE BIOGEL PI INDICATOR 8 (GLOVE) ×4
GOWN STRL REIN XL XLG (GOWN DISPOSABLE) ×6 IMPLANT
HEAD CERAMIC DELTA 36 PLUS 1.5 (Hips) ×3 IMPLANT
HOLDER FOLEY CATH W/STRAP (MISCELLANEOUS) ×3 IMPLANT
KIT TURNOVER KIT A (KITS) IMPLANT
MANIFOLD NEPTUNE II (INSTRUMENTS) ×3 IMPLANT
NS IRRIG 1000ML POUR BTL (IV SOLUTION) ×3 IMPLANT
PACK ANTERIOR HIP CUSTOM (KITS) ×3 IMPLANT
PINNACLE ALTRX PLUS 4 N 36X56 (Hips) ×3 IMPLANT
PROTECTOR NERVE ULNAR (MISCELLANEOUS) ×3 IMPLANT
RTRCTR WOUND ALEXIS 18CM MED (MISCELLANEOUS) ×3
STEM FEM ACTIS STD SZ7 (Nail) ×3 IMPLANT
SUT ETHIBOND NAB CT1 #1 30IN (SUTURE) ×6 IMPLANT
SUT VIC AB 1 CT1 36 (SUTURE) ×3 IMPLANT
SUT VIC AB 2-0 CT1 27 (SUTURE) ×2
SUT VIC AB 2-0 CT1 TAPERPNT 27 (SUTURE) ×1 IMPLANT
SUT VIC AB 3-0 PS2 18 (SUTURE) ×2
SUT VIC AB 3-0 PS2 18XBRD (SUTURE) ×1 IMPLANT
SUT VLOC 180 0 24IN GS25 (SUTURE) ×3 IMPLANT
SYR 50ML LL SCALE MARK (SYRINGE) ×3 IMPLANT
TRAY FOLEY MTR SLVR 16FR STAT (SET/KITS/TRAYS/PACK) ×3 IMPLANT
YANKAUER SUCT BULB TIP 10FT TU (MISCELLANEOUS) ×3 IMPLANT

## 2018-06-13 NOTE — Evaluation (Signed)
Physical Therapy Evaluation Patient Details Name: Jason KiefRobert Belt MRN: [id number] DOB: 03-22-1950 Today's Date: 06/13/2018   History of Present Illness  68 yo male s/p R THA-direct anterior 06/13/18  Clinical Impression  On eval POD 0, pt required Min assist for mobility. He walked ~120 feet with a RW. Mild pain with activity. Plan is for d/c home with OP PT f/u per pt.     Follow Up Recommendations Follow surgeon's recommendation for DC plan and follow-up therapies    Equipment Recommendations  Rolling walker with 5" wheels;3in1 (PT)    Recommendations for Other Services       Precautions / Restrictions Precautions Precautions: Fall Restrictions Weight Bearing Restrictions: No Other Position/Activity Restrictions: WBAT      Mobility  Bed Mobility Overal bed mobility: Needs Assistance Bed Mobility: Supine to Sit     Supine to sit: Min assist;HOB elevated     General bed mobility comments: Small amount of assist for R LE.   Transfers Overall transfer level: Needs assistance Equipment used: Rolling walker (2 wheeled) Transfers: Sit to/from Stand Sit to Stand: Min assist;From elevated surface         General transfer comment: VCs safety, technique, hand placement. Assist to rise, stabilize control descent.   Ambulation/Gait Ambulation/Gait assistance: Min assist Gait Distance (Feet): 120 Feet Assistive device: Rolling walker (2 wheeled) Gait Pattern/deviations: Step-through pattern;Decreased stride length     General Gait Details: Intermittent assist to steady. VCs safety, sequence. Pt quickly transitioned to a reciprocal gait pattern  Stairs            Wheelchair Mobility    Modified Rankin (Stroke Patients Only)       Balance Overall balance assessment: Mild deficits observed, not formally tested                                           Pertinent Vitals/Pain Pain Assessment: 0-10 Pain Score: 3  Pain Location: R hip Pain  Descriptors / Indicators: Sore;Aching Pain Intervention(s): Monitored during session;Repositioned;Ice applied    Home Living Family/patient expects to be discharged to:: Private residence Living Arrangements: Spouse/significant other   Type of Home: House Home Access: Stairs to enter Entrance Stairs-Rails: Right Entrance Stairs-Number of Steps: 2 Home Layout: Two level;Able to live on main level with bedroom/bathroom Home Equipment: Gilmer Morane - single point      Prior Function Level of Independence: Independent               Hand Dominance        Extremity/Trunk Assessment   Upper Extremity Assessment Upper Extremity Assessment: Overall WFL for tasks assessed    Lower Extremity Assessment Lower Extremity Assessment: (post op weakness 2* RTHA)       Communication   Communication: No difficulties  Cognition Arousal/Alertness: Awake/alert Behavior During Therapy: WFL for tasks assessed/performed Overall Cognitive Status: Within Functional Limits for tasks assessed                                        General Comments      Exercises     Assessment/Plan    PT Assessment Patient needs continued PT services  PT Problem List Decreased strength;Decreased range of motion;Decreased mobility;Decreased balance;Decreased knowledge of use of DME;Decreased activity tolerance;Pain       PT  Treatment Interventions DME instruction;Gait training;Therapeutic activities;Functional mobility training;Balance training;Patient/family education;Therapeutic exercise;Stair training    PT Goals (Current goals can be found in the Care Plan section)  Acute Rehab PT Goals Patient Stated Goal: regain active lifestyle PT Goal Formulation: With patient Time For Goal Achievement: 06/27/18 Potential to Achieve Goals: Good    Frequency 7X/week   Barriers to discharge        Co-evaluation               AM-PAC PT "6 Clicks" Mobility  Outcome Measure Help  needed turning from your back to your side while in a flat bed without using bedrails?: A Little Help needed moving from lying on your back to sitting on the side of a flat bed without using bedrails?: A Little Help needed moving to and from a bed to a chair (including a wheelchair)?: A Little Help needed standing up from a chair using your arms (e.g., wheelchair or bedside chair)?: A Little Help needed to walk in hospital room?: A Little Help needed climbing 3-5 steps with a railing? : A Little 6 Click Score: 18    End of Session Equipment Utilized During Treatment: Gait belt Activity Tolerance: Patient tolerated treatment well Patient left: in chair;with call bell/phone within reach   PT Visit Diagnosis: Pain;Other abnormalities of gait and mobility (R26.89) Pain - Right/Left: Right Pain - part of body: Hip    Time: 5248-1859 PT Time Calculation (min) (ACUTE ONLY): 23 min   Charges:   PT Evaluation $PT Eval Low Complexity: 1 Low PT Treatments $Gait Training: 8-22 mins         Rebeca Alert, PT Acute Rehabilitation Services Pager: [phone] Office: [phone]

## 2018-06-13 NOTE — Care Plan (Signed)
Ortho Bundle Case Management Note  Patient Details  Name: Jason Mosley MRN: [id number] Date of Birth: 30-Aug-1950     Spoke with patient prior to surgery. He plans to discharge to home with friend to assist. Rolling walker has been ordered for him for delivery to hospital room prior to discharge. He will go to [facility] in [place] for therapy . Patient and MD in agreement with plan. Choice offered.                  DME Arranged:  Dan Humphreys rolling DME Agency:  [employer]  HH Arranged:    HH Agency:     Additional Comments: Please contact me with any questions of if this plan should need to change.  Shauna Hugh,  RN,BSN,MHA,CCM  [facility]edic Specialist  [phone] 06/13/2018, 12:46 PM

## 2018-06-13 NOTE — Transfer of Care (Signed)
Immediate Anesthesia Transfer of Care Note  Patient: Jason Mosley  Procedure(s) Performed: TOTAL HIP ARTHROPLASTY ANTERIOR APPROACH (Right )  Patient Location: PACU  Anesthesia Type:Spinal  Level of Consciousness: awake, alert  and oriented  Airway & Oxygen Therapy: Patient Spontanous Breathing and Patient connected to face mask oxygen  Post-op Assessment: Report given to RN and Post -op Vital signs reviewed and stable  Post vital signs: Reviewed and stable  Last Vitals:  Vitals Value Taken Time  BP 104/65 06/13/2018  9:32 AM  Temp    Pulse 71 06/13/2018  9:34 AM  Resp 14 06/13/2018  9:34 AM  SpO2 100 % 06/13/2018  9:34 AM  Vitals shown include unvalidated device data.  Last Pain:  Vitals:   06/13/18 0551  TempSrc:   PainSc: 3       Patients Stated Pain Goal: 5 (06/13/18 0551)  Complications: No apparent anesthesia complications

## 2018-06-13 NOTE — Op Note (Signed)

## 2018-06-13 NOTE — Interval H&P Note (Signed)
History and Physical Interval Note:  06/13/2018 7:22 AM  Jason Mosley  has presented today for surgery, with the diagnosis of Right Hip Degenerative Joint Disease.  The various methods of treatment have been discussed with the patient and family. After consideration of risks, benefits and other options for treatment, the patient has consented to  Procedure(s): TOTAL HIP ARTHROPLASTY ANTERIOR APPROACH (Right) as a surgical intervention.  The patient's history has been reviewed, patient examined, no change in status, stable for surgery.  I have reviewed the patient's chart and labs.  Questions were answered to the patient's satisfaction.     Velna Ochs

## 2018-06-13 NOTE — Anesthesia Postprocedure Evaluation (Signed)
Anesthesia Post Note  Patient: Jason Mosley  Procedure(s) Performed: TOTAL HIP ARTHROPLASTY ANTERIOR APPROACH (Right )     Patient location during evaluation: PACU Anesthesia Type: Spinal Level of consciousness: awake and alert Pain management: pain level controlled Vital Signs Assessment: post-procedure vital signs reviewed and stable Respiratory status: spontaneous breathing and respiratory function stable Cardiovascular status: blood pressure returned to baseline and stable Postop Assessment: spinal receding and no apparent nausea or vomiting Anesthetic complications: no    Last Vitals:  Vitals:   06/13/18 1000 06/13/18 1021  BP: 116/72 110/79  Pulse: 63 62  Resp: 12 16  Temp: 36.4 C 36.4 C  SpO2: 100% 99%    Last Pain:  Vitals:   06/13/18 1021  TempSrc: Oral  PainSc:                  [name]

## 2018-06-13 NOTE — Plan of Care (Signed)

## 2018-06-13 NOTE — Anesthesia Procedure Notes (Signed)
Spinal  Patient location during procedure: OR Start time: 06/13/2018 7:29 AM End time: 06/13/2018 7:33 AM Staffing Anesthesiologist: [name], MD Performed: anesthesiologist  Preanesthetic Checklist Completed: patient identified, surgical consent, pre-op evaluation, timeout performed, IV checked, risks and benefits discussed and monitors and equipment checked Spinal Block Patient position: sitting Prep: DuraPrep Patient monitoring: heart rate, cardiac monitor, continuous pulse ox and blood pressure Approach: midline Location: L3-4 Injection technique: single-shot Needle Needle type: Pencan  Needle gauge: 24 G Additional Notes Consent was obtained prior to the procedure with all questions answered and concerns addressed. Risks including, but not limited to, bleeding, infection, nerve damage, paralysis, failed block, inadequate analgesia, allergic reaction, high spinal, itching, and headache were discussed and the patient wished to proceed. Functioning IV was confirmed and monitors were applied. Sterile prep and drape, including hand hygiene, mask, and sterile gloves were used. The patient was positioned and the spine was prepped. The skin was anesthetized with lidocaine. Free flow of clear CSF was obtained prior to injecting local anesthetic into the CSF. The spinal needle aspirated freely following injection. The needle was carefully withdrawn. The patient tolerated the procedure well.   Leslye Peer, MD

## 2018-06-14 ENCOUNTER — Encounter (HOSPITAL_COMMUNITY): Payer: Self-pay | Admitting: Orthopaedic Surgery

## 2018-06-14 DIAGNOSIS — M1611 Unilateral primary osteoarthritis, right hip: Secondary | ICD-10-CM | POA: Diagnosis not present

## 2018-06-14 MED ORDER — ASPIRIN 325 MG PO TBEC: 325.0000 mg | DELAYED_RELEASE_TABLET | Freq: Two times a day (BID) | ORAL | 0 refills | Status: DC

## 2018-06-14 MED ORDER — HYDROCODONE-ACETAMINOPHEN 5-325 MG PO TABS: 1.0000 | ORAL_TABLET | Freq: Four times a day (QID) | ORAL | 0 refills | Status: DC | PRN

## 2018-06-14 MED ORDER — TIZANIDINE HCL 4 MG PO TABS: 4.0000 mg | ORAL_TABLET | Freq: Four times a day (QID) | ORAL | 1 refills | Status: AC | PRN

## 2018-06-14 NOTE — Progress Notes (Signed)
Discharge plan of Care Per Ortho Bundle Case Management Note: Spoke with patient prior to surgery. He plans to discharge to home with friend to assist. Rolling walker has been ordered for him for delivery to hospital room prior to discharge. He will go to [facility] in [place] for therapy. No other needs identified.

## 2018-06-14 NOTE — Progress Notes (Signed)
Physical Therapy Treatment Patient Details Name: Jason KiefRobert Mosley MRN: [id number] DOB: Jul 13, 1950 Today's Date: 06/14/2018    History of Present Illness 68 yo male s/p R THA-direct anterior 06/13/18    PT Comments    Pt sitting in chair, friendly and willing to participate with therapy today.  Began session by reviewed HEP program, given printout and participated with exercises for LE mobility and strength, able to verbalize and demonstrate all appropriately.  Gait training with RW, good recipocal cadence WNL, min cueing for heel strike and equal stride length to normalize gait mechanics, no LOB.  Stair training complete with step to pattern, used 2 handrails for pt.'s comfort and instructed sidestep for 1 handrail at home.  EOS pt left in chair with ice applied to hip and call bell within reach.  Pain scale increased to 2/10 soreness following activities.      Follow Up Recommendations  Follow surgeon's recommendation for DC plan and follow-up therapies     Equipment Recommendations  Rolling walker with 5" wheels;3in1 (PT)    Recommendations for Other Services       Precautions / Restrictions Precautions Precautions: Fall Restrictions Weight Bearing Restrictions: No Other Position/Activity Restrictions: WBAT    Mobility  Bed Mobility Overal bed mobility: Modified Independent             General bed mobility comments: Sitting in chair upon therapist entrance  Transfers Overall transfer level: Modified independent Equipment used: Rolling walker (2 wheeled) Transfers: Sit to/from Stand Sit to Stand: Min assist         General transfer comment: VC-ing for hand placement with sit to stand, stable upon standing with RW  Ambulation/Gait Ambulation/Gait assistance: Min assist Gait Distance (Feet): 150 Feet Assistive device: Rolling walker (2 wheeled) Gait Pattern/deviations: Step-through pattern;Decreased stride length     General Gait Details: verbal cueing for heel  strike and equalized stride length for normalized gait mechanics   Stairs Stairs: Yes   Stair Management: One rail Right;Sideways Number of Stairs: 3 General stair comments: pt instructed with 2 handrails, 1 handrail, and sidestep up steps for comfort   Wheelchair Mobility    Modified Rankin (Stroke Patients Only)       Balance                                            Cognition Arousal/Alertness: Awake/alert Behavior During Therapy: WFL for tasks assessed/performed Overall Cognitive Status: Within Functional Limits for tasks assessed                                        Exercises Total Joint Exercises Ankle Circles/Pumps: Both;20 reps;Seated Quad Sets: Right;10 reps;Seated(long sit in recliner) Heel Slides: Right;Seated(long sit in recliner) Hip ABduction/ADduction: Both;10 reps;Seated(long sit in recliner) Knee Flexion: Both;10 reps;Standing(UE support by sink) Marching in Standing: Both;10 reps;Standing(UE support by sink)    General Comments        Pertinent Vitals/Pain Pain Assessment: No/denies pain Pain Location: R hip Pain Descriptors / Indicators: Sore;Aching Pain Intervention(s): Monitored during session;Repositioned;Ice applied    Home Living                      Prior Function            PT Goals (current goals  can now be found in the care plan section)      Frequency    7X/week      PT Plan Current plan remains appropriate    Co-evaluation              AM-PAC PT "6 Clicks" Mobility   Outcome Measure  Help needed turning from your back to your side while in a flat bed without using bedrails?: A Little Help needed moving from lying on your back to sitting on the side of a flat bed without using bedrails?: A Little Help needed moving to and from a bed to a chair (including a wheelchair)?: A Little Help needed standing up from a chair using your arms (e.g., wheelchair or bedside  chair)?: A Little Help needed to walk in hospital room?: A Little Help needed climbing 3-5 steps with a railing? : A Little 6 Click Score: 18    End of Session Equipment Utilized During Treatment: Gait belt Activity Tolerance: Patient tolerated treatment well Patient left: in chair;with call bell/phone within reach Nurse Communication: Mobility status PT Visit Diagnosis: Pain;Other abnormalities of gait and mobility (R26.89) Pain - Right/Left: Right Pain - part of body: Hip     Time: 4982-6415 PT Time Calculation (min) (ACUTE ONLY): 33 min  Charges:  $Gait Training: 8-22 mins $Therapeutic Exercise: 8-22 mins                    [address] LPTA; CBIS [phone]  Juel Burrow 06/14/2018, 11:55 AM

## 2018-06-14 NOTE — Discharge Summary (Signed)
Patient ID: Jason Mosley Angeletti MRN: [id number] DOB/AGE: 02/05/1950 68 y.o.  Admit date: 06/13/2018 Discharge date: 06/14/2018  Admission Diagnoses:  Principal Problem:   Primary localized osteoarthritis of right hip Active Problems:   Primary osteoarthritis of right hip   Discharge Diagnoses:  Same  Past Medical History:  Diagnosis Date  . Arthritis   . Complication of anesthesia   . COPD (chronic obstructive pulmonary disease) (HCC)    Diagnosised x 20 years ago. No complications since diagnosed  . Hypertension   . PONV (postoperative nausea and vomiting)     Surgeries: Procedure(s): TOTAL HIP ARTHROPLASTY ANTERIOR APPROACH on 06/13/2018   Consultants:   Discharged Condition: Improved  Hospital Course: Jason Mosley Henningsen is an 68 y.o. male who was admitted 06/13/2018 for operative treatment ofPrimary localized osteoarthritis of right hip. Patient has severe unremitting pain that affects sleep, daily activities, and work/hobbies. After pre-op clearance the patient was taken to the operating room on 06/13/2018 and underwent  Procedure(s): TOTAL HIP ARTHROPLASTY ANTERIOR APPROACH.    Patient was given perioperative antibiotics:  Anti-infectives (From admission, onward)   Start     Dose/Rate Route Frequency Ordered Stop   06/13/18 1400  ceFAZolin (ANCEF) IVPB 2g/100 mL premix     2 g 200 mL/hr over 30 Minutes Intravenous Every 6 hours 06/13/18 1023 06/13/18 2026   06/13/18 0600  ceFAZolin (ANCEF) IVPB 2g/100 mL premix     2 g 200 mL/hr over 30 Minutes Intravenous On call to O.R. 06/13/18 [id number] 06/13/18 0734       Patient was given sequential compression devices, early ambulation, and chemoprophylaxis to prevent DVT.  Patient benefited maximally from hospital stay and there were no complications.    Recent vital signs:  Patient Vitals for the past 24 hrs:  BP Temp Temp src Pulse Resp SpO2  06/14/18 0645 112/63 98.4 F (36.9 C) Oral 69 16 100 %  06/14/18 0200 123/72 98.1 F (36.7 C)  Oral 66 16 99 %  06/13/18 2200 111/70 98.2 F (36.8 C) Oral 63 16 99 %  06/13/18 1716 116/71 98.1 F (36.7 C) Oral 73 16 100 %  06/13/18 1324 116/71 98.9 F (37.2 C) Oral 72 16 100 %  06/13/18 1223 122/68 97.8 F (36.6 C) Oral 70 16 100 %  06/13/18 1126 135/76 (!) 97.5 F (36.4 C) Oral (!) 59 20 100 %  06/13/18 1021 110/79 97.6 F (36.4 C) Oral 62 16 99 %  06/13/18 1000 116/72 97.6 F (36.4 C) - 63 12 100 %  06/13/18 0945 120/73 - - 72 14 100 %  06/13/18 0932 104/65 (!) 97.4 F (36.3 C) - 78 20 100 %     Recent laboratory studies: No results for input(s): WBC, HGB, HCT, PLT, NA, K, CL, CO2, BUN, CREATININE, GLUCOSE, INR, CALCIUM in the last 72 hours.  Invalid input(s): PT, 2   Discharge Medications:   Allergies as of 06/14/2018      Reactions   Penicillins Itching   Did it involve swelling of the face/tongue/throat, SOB, or low BP? No Did it involve sudden or severe rash/hives, skin peeling, or any reaction on the inside of your mouth or nose? No Did you need to seek medical attention at a hospital or doctor's office? No When did it last happen?10 years ago If all above answers are "NO", may proceed with cephalosporin use.      Medication List    TAKE these medications   acetaminophen 650 MG CR tablet Commonly known as:  TYLENOL Take 1,300 mg by mouth every 8 (eight) hours as needed for pain.   aspirin 325 MG EC tablet Take 1 tablet (325 mg total) by mouth 2 (two) times daily after a meal.   atorvastatin 10 MG tablet Commonly known as:  LIPITOR Take 10 mg by mouth every evening.   B-12 PO Take 1 tablet by mouth daily.   diclofenac sodium 1 % Gel Commonly known as:  VOLTAREN Apply 1 application topically 3 (three) times daily.   diphenhydrAMINE 25 MG tablet Commonly known as:  BENADRYL Take 25 mg by mouth at bedtime as needed for sleep.   finasteride 5 MG tablet Commonly known as:  PROSCAR Take 5 mg by mouth every evening.    HYDROcodone-acetaminophen 5-325 MG tablet Commonly known as:  NORCO/VICODIN Take 1-2 tablets by mouth every 6 (six) hours as needed for moderate pain (pain score 4-6).   lisinopril 10 MG tablet Commonly known as:  ZESTRIL Take 10 mg by mouth every evening.   multivitamin with minerals Tabs tablet Take 1 tablet by mouth daily.   tamsulosin 0.4 MG Caps capsule Commonly known as:  FLOMAX Take 0.4 mg by mouth every evening.   tiZANidine 4 MG tablet Commonly known as:  Zanaflex Take 1 tablet (4 mg total) by mouth every 6 (six) hours as needed.            Durable Medical Equipment  (From admission, onward)         Start     Ordered   06/13/18 1024  DME Walker rolling  Once    Question:  Patient needs a walker to treat with the following condition  Answer:  Primary osteoarthritis of right hip   06/13/18 1023   06/13/18 1024  DME 3 n 1  Once     06/13/18 1023   06/13/18 1024  DME Bedside commode  Once    Question:  Patient needs a bedside commode to treat with the following condition  Answer:  Primary osteoarthritis of right hip   06/13/18 1023          Diagnostic Studies: Dg Chest 2 View  Result Date: 06/07/2018 CLINICAL DATA:  Preop hip surgery. EXAM: CHEST - 2 VIEW COMPARISON:  None. FINDINGS: Trachea is midline. Heart size normal. Lungs are hyperinflated but clear. No pleural fluid. IMPRESSION: Hyperinflation without acute finding. Electronically Signed   By: Leanna Battles M.D.   On: 06/07/2018 16:58   Dg C-arm 1-60 Min-no Report  Result Date: 06/13/2018 Fluoroscopy was utilized by the requesting physician.  No radiographic interpretation.   Dg Hip Operative Unilat With Pelvis Right  Result Date: 06/13/2018 CLINICAL DATA:  Right hip replacement EXAM: OPERATIVE RIGHT HIP (WITH PELVIS IF PERFORMED) 2 VIEWS TECHNIQUE: Fluoroscopic spot image(s) were submitted for interpretation post-operatively. COMPARISON:  None. FINDINGS: Changes of right hip replacement. Normal  alignment. No hardware bony complicating feature. IMPRESSION: Right hip replacement.  No visible complicating feature. Electronically Signed   By: Charlett Nose M.D.   On: 06/13/2018 09:13    Disposition: Discharge disposition: 01-Home or Self Care       Discharge Instructions    Call MD / Call 911   Complete by:  As directed    If you experience chest pain or shortness of breath, CALL 911 and be transported to the hospital emergency room.  If you develope a fever above 101 F, pus (white drainage) or increased drainage or redness at the wound, or calf pain, call your surgeon's  office.   Constipation Prevention   Complete by:  As directed    Drink plenty of fluids.  Prune juice may be helpful.  You may use a stool softener, such as Colace (over the counter) 100 mg twice a day.  Use MiraLax (over the counter) for constipation as needed.   Diet - low sodium heart healthy   Complete by:  As directed    Discharge instructions   Complete by:  As directed    INSTRUCTIONS AFTER JOINT REPLACEMENT   Remove items at home which could result in a fall. This includes throw rugs or furniture in walking pathways ICE to the affected joint every three hours while awake for 30 minutes at a time, for at least the first 3-5 days, and then as needed for pain and swelling.  Continue to use ice for pain and swelling. You may notice swelling that will progress down to the foot and ankle.  This is normal after surgery.  Elevate your leg when you are not up walking on it.   Continue to use the breathing machine you got in the hospital (incentive spirometer) which will help keep your temperature down.  It is common for your temperature to cycle up and down following surgery, especially at night when you are not up moving around and exerting yourself.  The breathing machine keeps your lungs expanded and your temperature down.   DIET:  As you were doing prior to hospitalization, we recommend a well-balanced  diet.  DRESSING / WOUND CARE / SHOWERING  You may shower 3 days after surgery, but keep the wounds dry during showering.  You may use an occlusive plastic wrap (Press'n Seal for example), NO SOAKING/SUBMERGING IN THE BATHTUB.  If the bandage gets wet, change with a clean dry gauze.  If the incision gets wet, pat the wound dry with a clean towel.  ACTIVITY  Increase activity slowly as tolerated, but follow the weight bearing instructions below.   No driving for 6 weeks or until further direction given by your physician.  You cannot drive while taking narcotics.  No lifting or carrying greater than 10 lbs. until further directed by your surgeon. Avoid periods of inactivity such as sitting longer than an hour when not asleep. This helps prevent blood clots.  You may return to work once you are authorized by your doctor.     WEIGHT BEARING   Weight bearing as tolerated with assist device (walker, cane, etc) as directed, use it as long as suggested by your surgeon or therapist, typically at least 4-6 weeks.   EXERCISES  Results after joint replacement surgery are often greatly improved when you follow the exercise, range of motion and muscle strengthening exercises prescribed by your doctor. Safety measures are also important to protect the joint from further injury. Any time any of these exercises cause you to have increased pain or swelling, decrease what you are doing until you are comfortable again and then slowly increase them. If you have problems or questions, call your caregiver or physical therapist for advice.   Rehabilitation is important following a joint replacement. After just a few days of immobilization, the muscles of the leg can become weakened and shrink (atrophy).  These exercises are designed to build up the tone and strength of the thigh and leg muscles and to improve motion. Often times heat used for twenty to thirty minutes before working out will loosen up your tissues  and help with improving the range of  motion but do not use heat for the first two weeks following surgery (sometimes heat can increase post-operative swelling).   These exercises can be done on a training (exercise) mat, on the floor, on a table or on a bed. Use whatever works the best and is most comfortable for you.    Use music or television while you are exercising so that the exercises are a pleasant break in your day. This will make your life better with the exercises acting as a break in your routine that you can look forward to.   Perform all exercises about fifteen times, three times per day or as directed.  You should exercise both the operative leg and the other leg as well.   Exercises include:   Quad Sets - Tighten up the muscle on the front of the thigh (Quad) and hold for 5-10 seconds.   Straight Leg Raises - With your knee straight (if you were given a brace, keep it on), lift the leg to 60 degrees, hold for 3 seconds, and slowly lower the leg.  Perform this exercise against resistance later as your leg gets stronger.  Leg Slides: Lying on your back, slowly slide your foot toward your buttocks, bending your knee up off the floor (only go as far as is comfortable). Then slowly slide your foot back down until your leg is flat on the floor again.  Angel Wings: Lying on your back spread your legs to the side as far apart as you can without causing discomfort.  Hamstring Strength:  Lying on your back, push your heel against the floor with your leg straight by tightening up the muscles of your buttocks.  Repeat, but this time bend your knee to a comfortable angle, and push your heel against the floor.  You may put a pillow under the heel to make it more comfortable if necessary.   A rehabilitation program following joint replacement surgery can speed recovery and prevent re-injury in the future due to weakened muscles. Contact your doctor or a physical therapist for more information on knee  rehabilitation.    CONSTIPATION  Constipation is defined medically as fewer than three stools per week and severe constipation as less than one stool per week.  Even if you have a regular bowel pattern at home, your normal regimen is likely to be disrupted due to multiple reasons following surgery.  Combination of anesthesia, postoperative narcotics, change in appetite and fluid intake all can affect your bowels.   YOU MUST use at least one of the following options; they are listed in order of increasing strength to get the job done.  They are all available over the counter, and you may need to use some, POSSIBLY even all of these options:    Drink plenty of fluids (prune juice may be helpful) and high fiber foods Colace 100 mg by mouth twice a day  Senokot for constipation as directed and as needed Dulcolax (bisacodyl), take with full glass of water  Miralax (polyethylene glycol) once or twice a day as needed.  If you have tried all these things and are unable to have a bowel movement in the first 3-4 days after surgery call either your surgeon or your primary doctor.    If you experience loose stools or diarrhea, hold the medications until you stool forms back up.  If your symptoms do not get better within 1 week or if they get worse, check with your doctor.  If you experience "the  worst abdominal pain ever" or develop nausea or vomiting, please contact the office immediately for further recommendations for treatment.   ITCHING:  If you experience itching with your medications, try taking only a single pain pill, or even half a pain pill at a time.  You can also use Benadryl over the counter for itching or also to help with sleep.   TED HOSE STOCKINGS:  Use stockings on both legs until for at least 2 weeks or as directed by physician office. They may be removed at night for sleeping.  MEDICATIONS:  See your medication summary on the "After Visit Summary" that nursing will review with you.   You may have some home medications which will be placed on hold until you complete the course of blood thinner medication.  It is important for you to complete the blood thinner medication as prescribed.  PRECAUTIONS:  If you experience chest pain or shortness of breath - call 911 immediately for transfer to the hospital emergency department.   If you develop a fever greater that 101 F, purulent drainage from wound, increased redness or drainage from wound, foul odor from the wound/dressing, or calf pain - CONTACT YOUR SURGEON.                                                   FOLLOW-UP APPOINTMENTS:  If you do not already have a post-op appointment, please call the office for an appointment to be seen by your surgeon.  Guidelines for how soon to be seen are listed in your "After Visit Summary", but are typically between 1-4 weeks after surgery.  OTHER INSTRUCTIONS:   Knee Replacement:  Do not place pillow under knee, focus on keeping the knee straight while resting. CPM instructions: 0-90 degrees, 2 hours in the morning, 2 hours in the afternoon, and 2 hours in the evening. Place foam block, curve side up under heel at all times except when in CPM or when walking.  DO NOT modify, tear, cut, or change the foam block in any way.  MAKE SURE YOU:  Understand these instructions.  Get help right away if you are not doing well or get worse.    Thank you for letting us be a part of your medical care team.  It is a privilege we respect greatly.  We hope these instructions will help you stay on track for a fast and full recovery!   Increase activity slowly as tolerated   Complete by:  As directed       Follow-up Information    Marcene Corning, MD. Go on 06/23/2018.   Specialty:  Orthopedic Surgery Why:  Your appointment is scheduled for 9:15.  Contact information: [address] [place] [address] [phone]        Therapy, [facility]. Go on 06/19/2018.   Specialty:  Physical  Therapy Why:  You are scheduled to start outpatient physical therapy at 11:15. Please arrive a few minutes early to complete your paperwork  Contact information: [address] [place] [address] [phone]            Signed: Ginger Organ Mariana Wiederholt 06/14/2018, 7:59 AM

## 2018-06-14 NOTE — Progress Notes (Signed)
Subjective: 1 Day Post-Op Procedure(s) (LRB): TOTAL HIP ARTHROPLASTY ANTERIOR APPROACH (Right)   Patient feels well and is looking forward to going home.  Activity level:  wbat Diet tolerance:  ok Voiding:  Foley out this morning. Patient reports pain as mild.    Objective: Vital signs in last 24 hours: Temp:  [97.4 F (36.3 C)-98.9 F (37.2 C)] 98.4 F (36.9 C) (06/03 0645) Pulse Rate:  [59-78] 69 (06/03 0645) Resp:  [12-20] 16 (06/03 0645) BP: (104-135)/(63-79) 112/63 (06/03 0645) SpO2:  [99 %-100 %] 100 % (06/03 0645)  Labs: No results for input(s): HGB in the last 72 hours. No results for input(s): WBC, RBC, HCT, PLT in the last 72 hours. No results for input(s): NA, K, CL, CO2, BUN, CREATININE, GLUCOSE, CALCIUM in the last 72 hours. No results for input(s): LABPT, INR in the last 72 hours.  Physical Exam:  Neurologically intact ABD soft Neurovascular intact Sensation intact distally Intact pulses distally Dorsiflexion/Plantar flexion intact Incision: dressing C/D/I and no drainage No cellulitis present Compartment soft  Assessment/Plan:  1 Day Post-Op Procedure(s) (LRB): TOTAL HIP ARTHROPLASTY ANTERIOR APPROACH (Right) Advance diet Up with therapy D/C IV fluids Discharge home with home health Today after PT if cleared and doing well with PT. Continue on ASA 325mg  BID for DVT prevention. Follow up in office 2 week post op.  Jason Mosley Jason Mosley 06/14/2018, 7:55 AM

## 2018-06-14 NOTE — Care Management Obs Status (Signed)
MEDICARE OBSERVATION STATUS NOTIFICATION   Patient Details  Name: Jason Mosley MRN: [id number] Date of Birth: 20-Sep-1950   Medicare Observation Status Notification Given:  Yes    Clearance Coots, LCSW 06/14/2018, 12:19 PM

## 2019-06-01 ENCOUNTER — Other Ambulatory Visit: Payer: Self-pay | Admitting: Orthopaedic Surgery

## 2019-06-22 NOTE — Patient Instructions (Addendum)
DUE TO COVID-19 ONLY ONE VISITOR IS ALLOWED TO COME WITH YOU AND STAY IN THE WAITING ROOM ONLY DURING PRE OP AND PROCEDURE DAY OF SURGERY. THE 2 VISITORS  MAY VISIT WITH YOU AFTER SURGERY IN YOUR PRIVATE ROOM DURING VISITING HOURS ONLY!  YOU NEED TO HAVE A COVID 19 TEST ON_6/18______ @_12 :20______, THIS TEST MUST BE DONE BEFORE SURGERY, COME  [address], [place] , .  [facility]) ONCE YOUR COVID TEST IS COMPLETED, PLEASE BEGIN THE QUARANTINE INSTRUCTIONS AS OUTLINED IN YOUR HANDOUT.                Jason Mosley    Your procedure is scheduled on: 07/03/19   Report to [facility] Main  Entrance   Report to Short Stay at 5:30 AM     Call this number if you have problems the morning of surgery [phone]    . BRUSH YOUR TEETH MORNING OF SURGERY AND RINSE YOUR MOUTH OUT, NO CHEWING GUM CANDY OR MINTS.   Do not eat food After Midnight.   YOU MAY HAVE CLEAR LIQUIDS FROM MIDNIGHT UNTIL 4:30 AM.   At 4:30 AM Please finish the prescribed Pre-Surgery  drink.   Nothing by mouth after you finish the  drink !    Take these medicines the morning of surgery with A SIP OF WATER: None                                 You may not have any metal on your body including              piercings  Do not wear jewelry,  lotions, powders or deodorant                        Men may shave face and neck.   Do not bring valuables to the hospital. [facility] IS NOT             RESPONSIBLE   FOR VALUABLES.  Contacts, dentures or bridgework may not be worn into surgery.      Patients discharged the day of surgery will not be allowed to drive home.   IF YOU ARE HAVING SURGERY AND GOING HOME THE SAME DAY, YOU MUST HAVE AN ADULT TO DRIVE YOU HOME AND BE WITH YOU FOR 24 HOURS.   YOU MAY GO HOME BY TAXI OR UBER OR ORTHERWISE, BUT AN ADULT MUST ACCOMPANY YOU HOME AND STAY WITH YOU FOR 24 HOURS.  Name and phone number of your driver:  Special Instructions: N/A               Please read over the following fact sheets you were given: _____________________________________________________________________             [facility] - Preparing for Surgery Before surgery, you can play an important role.   Because skin is not sterile, your skin needs to be as free of germs as possible.   You can reduce the number of germs on your skin by washing with CHG (chlorahexidine gluconate) soap before surgery.   CHG is an antiseptic cleaner which kills germs and bonds with the skin to continue killing germs even after washing. Please DO NOT use if you have an allergy to CHG or antibacterial soaps.   If your skin becomes reddened/irritated stop using the CHG and inform your nurse when you arrive  at Short Stay.  You may shave your face/neck.  Please follow these instructions carefully:   1.  Shower with CHG Soap the night before surgery and the  morning of Surgery.  2.  If you choose to wash your hair, wash your hair first as usual with your  normal  shampoo.  3.  After you shampoo, rinse your hair and body thoroughly to remove the  shampoo.                                        4.  Use CHG as you would any other liquid soap.  You can apply chg directly  to the skin and wash                       Gently with a scrungie or clean washcloth.  5.  Apply the CHG Soap to your body ONLY FROM THE NECK DOWN.   Do not use on face/ open                           Wound or open sores. Avoid contact with eyes, ears mouth and genitals (private parts).                       Wash face,  Genitals (private parts) with your normal soap.             6.  Wash thoroughly, paying special attention to the area where your surgery  will be performed.  7.  Thoroughly rinse your body with warm water from the neck down.  8.  DO NOT shower/wash with your normal soap after using and rinsing off  the CHG Soap.             9.  Pat yourself dry with a clean towel.            10.  Wear clean pajamas.             11.  Place clean sheets on your bed the night of your first shower and do not  sleep with pets.  Day of Surgery : Do not apply any lotions/deodorants the morning of surgery.  Please wear clean clothes to the hospital/surgery center.  FAILURE TO FOLLOW THESE INSTRUCTIONS MAY RESULT IN THE CANCELLATION OF YOUR SURGERY PATIENT SIGNATURE_________________________________  NURSE SIGNATURE__________________________________  ________________________________________________________________________   [name]  An incentive spirometer is a tool that can help keep your lungs clear and active. This tool measures how well you are filling your lungs with each breath. Taking long deep breaths may help reverse or decrease the chance of developing breathing (pulmonary) problems (especially infection) following:  A long period of time when you are unable to move or be active. BEFORE THE PROCEDURE   If the spirometer includes an indicator to show your best effort, your nurse or respiratory therapist will set it to a desired goal.  If possible, sit up straight or lean slightly forward. Try not to slouch.  Hold the incentive spirometer in an upright position. INSTRUCTIONS FOR USE  1. Sit on the edge of your bed if possible, or sit up as far as you can in bed or on a chair. 2. Hold the incentive spirometer in an upright position. 3. Breathe out normally. 4. Place the mouthpiece in your mouth  and seal your lips tightly around it. 5. Breathe in slowly and as deeply as possible, raising the piston or the ball toward the top of the column. 6. Hold your breath for 3-5 seconds or for as long as possible. Allow the piston or ball to fall to the bottom of the column. 7. Remove the mouthpiece from your mouth and breathe out normally. 8. Rest for a few seconds and repeat Steps 1 through 7 at least 10 times every 1-2 hours when you are awake. Take your time and take a few normal breaths between  deep breaths. 9. The spirometer may include an indicator to show your best effort. Use the indicator as a goal to work toward during each repetition. 10. After each set of 10 deep breaths, practice coughing to be sure your lungs are clear. If you have an incision (the cut made at the time of surgery), support your incision when coughing by placing a pillow or rolled up towels firmly against it. Once you are able to get out of bed, walk around indoors and cough well. You may stop using the incentive spirometer when instructed by your caregiver.  RISKS AND COMPLICATIONS  Take your time so you do not get dizzy or light-headed.  If you are in pain, you may need to take or ask for pain medication before doing incentive spirometry. It is harder to take a deep breath if you are having pain. AFTER USE  Rest and breathe slowly and easily.  It can be helpful to keep track of a log of your progress. Your caregiver can provide you with a simple table to help with this. If you are using the spirometer at home, follow these instructions: [place] IF:   You are having difficultly using the spirometer.  You have trouble using the spirometer as often as instructed.  Your pain medication is not giving enough relief while using the spirometer.  You develop fever of 100.5 F (38.1 C) or higher. SEEK IMMEDIATE MEDICAL CARE IF:   You cough up bloody sputum that had not been present before.  You develop fever of 102 F (38.9 C) or greater.  You develop worsening pain at or near the incision site. MAKE SURE YOU:   Understand these instructions.  Will watch your condition.  Will get help right away if you are not doing well or get worse. Document Released: 05/10/2006 Document Revised: 03/22/2011 Document Reviewed: 07/11/2006 [facility] Patient Information 2014 [place], [place].   ________________________________________________________________________

## 2019-06-25 ENCOUNTER — Other Ambulatory Visit: Payer: Self-pay

## 2019-06-25 ENCOUNTER — Encounter (HOSPITAL_COMMUNITY)
Admission: RE | Admit: 2019-06-25 | Discharge: 2019-06-25 | Disposition: A | Discharge status: Home / Self Care | Payer: Medicare Other | Source: Ambulatory Visit | Attending: Orthopaedic Surgery | Admitting: Orthopaedic Surgery

## 2019-06-25 ENCOUNTER — Encounter (HOSPITAL_COMMUNITY): Payer: Self-pay

## 2019-06-25 DIAGNOSIS — Z01812 Encounter for preprocedural laboratory examination: Secondary | ICD-10-CM | POA: Insufficient documentation

## 2019-06-25 HISTORY — DX: Cardiac arrhythmia, unspecified: I49.9

## 2019-06-25 NOTE — Progress Notes (Signed)
COVID Vaccine Completed:Yes Date COVID Vaccine completed:03/24/19 COVID vaccine manufacturer:  Moderna    PCP - Dr. Fonnie Jarvis Cardiologist - Dr. Ralene Cork in [place]  Chest x-ray -no  EKG - 06/01/19 Stress Test - no ECHO - no Cardiac Cath - no  Sleep Study - Yes CPAP - can't tolerate the mask and had significant wt loss  Fasting Blood Sugar - NA Checks Blood Sugar _____ times a day  Blood Thinner Instructions:NA Aspirin Instructions: Last Dose:  Anesthesia review:   Patient denies shortness of breath, fever, cough and chest pain at PAT appointment yes  Patient verbalized understanding of instructions that were given to them at the PAT appointment. Patient was also instructed that they will need to review over the PAT instructions again at home before surgery. yes

## 2019-06-26 ENCOUNTER — Encounter (HOSPITAL_COMMUNITY)
Admission: RE | Admit: 2019-06-26 | Discharge: 2019-06-26 | Disposition: A | Discharge status: Home / Self Care | Payer: Medicare Other | Source: Ambulatory Visit | Attending: Orthopaedic Surgery | Admitting: Orthopaedic Surgery

## 2019-06-26 ENCOUNTER — Ambulatory Visit (HOSPITAL_COMMUNITY)
Admission: RE | Admit: 2019-06-26 | Discharge: 2019-06-26 | Disposition: A | Discharge status: Home / Self Care | Payer: Medicare Other | Source: Ambulatory Visit | Attending: Orthopaedic Surgery | Admitting: Orthopaedic Surgery

## 2019-06-26 DIAGNOSIS — J929 Pleural plaque without asbestos: Secondary | ICD-10-CM | POA: Diagnosis not present

## 2019-06-26 DIAGNOSIS — J9811 Atelectasis: Secondary | ICD-10-CM | POA: Insufficient documentation

## 2019-06-26 DIAGNOSIS — Z01818 Encounter for other preprocedural examination: Secondary | ICD-10-CM

## 2019-06-26 LAB — CBC WITH DIFFERENTIAL/PLATELET
Abs Immature Granulocytes: 0.01 10*3/uL (ref 0.00–0.07)
Basophils Absolute: 0 10*3/uL (ref 0.0–0.1)
Basophils Relative: 1 %
Eosinophils Absolute: 0.1 10*3/uL (ref 0.0–0.5)
Eosinophils Relative: 2 %
HCT: 42 % (ref 39.0–52.0)
Hemoglobin: 14 g/dL (ref 13.0–17.0)
Immature Granulocytes: 0 %
Lymphocytes Relative: 25 %
Lymphs Abs: 1.2 10*3/uL (ref 0.7–4.0)
MCH: 33.1 pg (ref 26.0–34.0)
MCHC: 33.3 g/dL (ref 30.0–36.0)
MCV: 99.3 fL (ref 80.0–100.0)
Monocytes Absolute: 0.5 10*3/uL (ref 0.1–1.0)
Monocytes Relative: 11 %
Neutro Abs: 3 10*3/uL (ref 1.7–7.7)
Neutrophils Relative %: 61 %
Platelets: 217 10*3/uL (ref 150–400)
RBC: 4.23 MIL/uL (ref 4.22–5.81)
RDW: 12.7 % (ref 11.5–15.5)
WBC: 4.8 10*3/uL (ref 4.0–10.5)
nRBC: 0 % (ref 0.0–0.2)

## 2019-06-26 LAB — URINALYSIS, ROUTINE W REFLEX MICROSCOPIC
Bilirubin Urine: NEGATIVE
Glucose, UA: NEGATIVE mg/dL
Hgb urine dipstick: NEGATIVE
Ketones, ur: NEGATIVE mg/dL
Leukocytes,Ua: NEGATIVE
Nitrite: NEGATIVE
Protein, ur: NEGATIVE mg/dL
Specific Gravity, Urine: 1.009 (ref 1.005–1.030)
pH: 6 (ref 5.0–8.0)

## 2019-06-26 LAB — BASIC METABOLIC PANEL
Anion gap: 9 (ref 5–15)
BUN: 13 mg/dL (ref 8–23)
CO2: 26 mmol/L (ref 22–32)
Calcium: 9.3 mg/dL (ref 8.9–10.3)
Chloride: 101 mmol/L (ref 98–111)
Creatinine, Ser: 1.08 mg/dL (ref 0.61–1.24)
GFR calc Af Amer: >60 mL/min (ref >60)
GFR calc non Af Amer: >60 mL/min (ref >60)
Glucose, Bld: 116 mg/dL — ABNORMAL HIGH (ref 70–99)
Potassium: 4.8 mmol/L (ref 3.5–5.1)
Sodium: 136 mmol/L (ref 135–145)

## 2019-06-26 LAB — APTT: aPTT: 29 seconds (ref 24–36)

## 2019-06-26 LAB — PROTIME-INR
INR: 1 (ref 0.8–1.2)
Prothrombin Time: 12.9 seconds (ref 11.4–15.2)

## 2019-06-26 LAB — SURGICAL PCR SCREEN
MRSA, PCR: NEGATIVE
Staphylococcus aureus: NEGATIVE

## 2019-06-28 NOTE — H&P (Signed)
TOTAL KNEE ADMISSION H&P  Patient is being admitted for left total knee arthroplasty.  Subjective:  Chief Complaint:left knee pain.  HPI: Jason Mosley, 69 y.o. male, has a history of pain and functional disability in the left knee due to arthritis and has failed non-surgical conservative treatments for greater than 12 weeks to includeNSAID's and/or analgesics, corticosteriod injections, viscosupplementation injections, flexibility and strengthening excercises, use of assistive devices, weight reduction as appropriate and activity modification.  Onset of symptoms was gradual, starting 5 years ago with gradually worsening course since that time. The patient noted no past surgery on the left knee(s).  Patient currently rates pain in the left knee(s) at 10 out of 10 with activity. Patient has night pain, worsening of pain with activity and weight bearing, pain that interferes with activities of daily living, crepitus and joint swelling.  Patient has evidence of subchondral cysts, subchondral sclerosis, periarticular osteophytes and joint space narrowing by imaging studies. There is no active infection.  Patient Active Problem List   Diagnosis Date Noted  . Primary localized osteoarthritis of right hip 06/13/2018  . Primary osteoarthritis of right hip 06/13/2018   Past Medical History:  Diagnosis Date  . Arthritis   . Complication of anesthesia   . COPD (chronic obstructive pulmonary disease) (HCC)    Diagnosised x 20 years ago. No complications since diagnosed  . 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 Hypertension   . PONV (postoperative nausea and vomiting)     Past Surgical History:  Procedure Laterality Date  . FOOT SURGERY    . HERNIA REPAIR     Inguinal  . TOTAL HIP ARTHROPLASTY Right 06/13/2018   Procedure: TOTAL HIP ARTHROPLASTY ANTERIOR APPROACH;  Surgeon: Melrose Nakayama, MD;  Location: [facility];  Service: Orthopedics;  Laterality: Right;    No current facility-administered  medications for this encounter.   Current Outpatient Medications  Medication Sig Dispense Refill Last Dose  . acetaminophen (TYLENOL) 650 MG CR tablet Take 1,300 mg by mouth every 8 (eight) hours as needed for pain.     Marland Kitchen atorvastatin (LIPITOR) 10 MG tablet Take 10 mg by mouth every evening.     . cholecalciferol (VITAMIN D3) 25 MCG (1000 UNIT) tablet Take 1,000 Units by mouth daily.     . diclofenac sodium (VOLTAREN) 1 % GEL Apply 1 application topically in the morning, at noon, and at bedtime.      . finasteride (PROSCAR) 5 MG tablet Take 5 mg by mouth every evening.     Marland Kitchen lisinopril (ZESTRIL) 20 MG tablet Take 20 mg by mouth every evening.      . tamsulosin (FLOMAX) 0.4 MG CAPS capsule Take 0.4 mg by mouth every evening.     . vitamin B-12 (CYANOCOBALAMIN) 1000 MCG tablet Take 1,000 mcg by mouth daily.     Marland Kitchen aspirin EC 325 MG EC tablet Take 1 tablet (325 mg total) by mouth 2 (two) times daily after a meal. (Patient not taking: Reported on 06/12/2019) 60 tablet 0 Not Taking at Unknown time  . HYDROcodone-acetaminophen (NORCO/VICODIN) 5-325 MG tablet Take 1-2 tablets by mouth every 6 (six) hours as needed for moderate pain (pain score 4-6). (Patient not taking: Reported on 06/12/2019) 30 tablet 0 Not Taking at Unknown time   Allergies  Allergen Reactions  . Penicillins Itching    Did it involve swelling of the face/tongue/throat, SOB, or low BP? No Did it involve sudden or severe rash/hives, skin peeling, or any reaction on  the inside of your mouth or nose? No Did you need to seek medical attention at a hospital or doctor's office? No When did it last happen?10 years ago If all above answers are "NO", may proceed with cephalosporin use.     Social History   Tobacco Use  . Smoking status: Former Smoker    Packs/day: 3.00    Years: 50.00    Pack years: 150.00    Types: Cigarettes    Quit date: 06/25/2007    Years since quitting: 12.0  . Smokeless tobacco: Never Used  . Tobacco  comment: closet smoker  Substance Use Topics  . Alcohol use: Yes    Alcohol/week: 6.0 standard drinks    Types: 6 Glasses of wine per week    Comment: Fridays /Saturday weekly    No family history on file.   Review of Systems  Musculoskeletal: Positive for arthralgias.       Left knee  All other systems reviewed and are negative.   Objective:  Physical Exam  Constitutional: He is oriented to person, place, and time.  Eyes: Pupils are equal, round, and reactive to light.  Cardiovascular: Normal rate and normal pulses.  Respiratory: Effort normal.  GI: Soft. Normal appearance.  Musculoskeletal:     Cervical back: Normal range of motion.     Comments: Left knee motion remains about 0-120.  He has a moderate valgus deformity.  He has lateral greater than medial joint line pain with crepitation and no effusion today.  Hip motion is good and straight leg raise is negative.  Sensation and motor function are intact in his feet with palpable pulses on both sides.    Neurological: He is alert and oriented to person, place, and time.  Skin: Skin is warm and dry.  Psychiatric: His behavior is normal. Mood, judgment and thought content normal.    Vital signs in last 24 hours:    Labs:   Estimated body mass index is 27.98 kg/m as calculated from the following:   Height as of 06/26/19: 5\' 10"  (1.778 m).   Weight as of 06/26/19: 88.5 kg.   Imaging Review Plain radiographs demonstrate severe degenerative joint disease of the left knee(s). The overall alignment isneutral. The bone quality appears to be good for age and reported activity level.   Assessment/Plan:  End stage arthritis, left knee   The patient history, physical examination, clinical judgment of the provider and imaging studies are consistent with end stage degenerative joint disease of the left knee(s) and total knee arthroplasty is deemed medically necessary. The treatment options including medical management, injection  therapy arthroscopy and arthroplasty were discussed at length. The risks and benefits of total knee arthroplasty were presented and reviewed. The risks due to aseptic loosening, infection, stiffness, patella tracking problems, thromboembolic complications and other imponderables were discussed. The patient acknowledged the explanation, agreed to proceed with the plan and consent was signed. Patient is being admitted for inpatient treatment for surgery, pain control, PT, OT, prophylactic antibiotics, VTE prophylaxis, progressive ambulation and ADL's and discharge planning. The patient is planning to be discharged home with home health services   Patient's anticipated LOS is less than 2 midnights, meeting these requirements: - Younger than 64 - Lives within 1 hour of care - Has a competent adult at home to recover with post-op recover - NO history of  - Chronic pain requiring opiods  - Diabetes  - Coronary Artery Disease  - Heart failure  - Heart attack  -  Stroke  - DVT/VTE  - Cardiac arrhythmia  - Respiratory Failure/COPD  - Renal failure  - Anemia  - Advanced Liver disease

## 2019-06-29 ENCOUNTER — Other Ambulatory Visit (HOSPITAL_COMMUNITY)
Admission: RE | Admit: 2019-06-29 | Discharge: 2019-06-29 | Disposition: A | Discharge status: Home / Self Care | Payer: Medicare Other | Source: Ambulatory Visit | Attending: Orthopaedic Surgery | Admitting: Orthopaedic Surgery

## 2019-06-29 DIAGNOSIS — Z01812 Encounter for preprocedural laboratory examination: Secondary | ICD-10-CM | POA: Diagnosis present

## 2019-06-29 DIAGNOSIS — Z20822 Contact with and (suspected) exposure to covid-19: Secondary | ICD-10-CM | POA: Insufficient documentation

## 2019-06-30 LAB — SARS CORONAVIRUS 2 (TAT 6-24 HRS): SARS Coronavirus 2: NEGATIVE

## 2019-07-02 MED ORDER — BUPIVACAINE LIPOSOME 1.3 % IJ SUSP
20.0000 mL | Freq: Once | INTRAMUSCULAR | Status: DC
  Filled 2019-07-02: qty 20

## 2019-07-02 NOTE — Anesthesia Preprocedure Evaluation (Addendum)
Anesthesia Evaluation  Patient identified by MRN, date of birth, ID band Patient awake    Reviewed: Allergy & Precautions, NPO status , Patient's Chart, lab work & pertinent test results  History of Anesthesia Complications Negative for: history of anesthetic complications  Airway Mallampati: II  TM Distance: >3 FB Neck ROM: Full    Dental  (+) Dental Advisory Given, Chipped   Pulmonary COPD, former smoker,  06/29/2019 SARS coronavirus NEG   breath sounds clear to auscultation       Cardiovascular hypertension, Pt. on medications (-) angina+ dysrhythmias (occ palpitations)  Rhythm:Regular Rate:Normal     Neuro/Psych negative neurological ROS     GI/Hepatic negative GI ROS, Neg liver ROS,   Endo/Other  negative endocrine ROS  Renal/GU negative Renal ROS     Musculoskeletal  (+) Arthritis , Osteoarthritis,    Abdominal   Peds  Hematology negative hematology ROS (+)   Anesthesia Other Findings   Reproductive/Obstetrics                            Anesthesia Physical Anesthesia Plan  ASA: III  Anesthesia Plan: Spinal   Post-op Pain Management:  [facility] for Post-op pain   Induction:   PONV Risk Score and Plan: 1 and Treatment may vary due to age or medical condition  Airway Management Planned: Natural Airway and Simple Face Mask  Additional Equipment:   Intra-op Plan:   Post-operative Plan:   Informed Consent: I have reviewed the patients History and Physical, chart, labs and discussed the procedure including the risks, benefits and alternatives for the proposed anesthesia with the patient or authorized representative who has indicated his/her understanding and acceptance.     Dental advisory given  Plan Discussed with: CRNA and Surgeon  Anesthesia Plan Comments: (Plan routine monitors, SAB with adductor canal block for post op analgesia)       Anesthesia Quick  Evaluation

## 2019-07-03 ENCOUNTER — Ambulatory Visit (HOSPITAL_COMMUNITY): Payer: Medicare Other | Admitting: Physician Assistant

## 2019-07-03 ENCOUNTER — Encounter (HOSPITAL_COMMUNITY)
Admission: RE | Disposition: A | Discharge status: Home / Self Care | Payer: Self-pay | Source: Ambulatory Visit | Attending: Orthopaedic Surgery

## 2019-07-03 ENCOUNTER — Encounter (HOSPITAL_COMMUNITY): Payer: Self-pay | Admitting: Orthopaedic Surgery

## 2019-07-03 ENCOUNTER — Observation Stay (HOSPITAL_COMMUNITY)
Admission: RE | Admit: 2019-07-03 | Discharge: 2019-07-04 | Disposition: A | Discharge status: Home / Self Care | Payer: Medicare Other | Source: Ambulatory Visit | Attending: Orthopaedic Surgery | Admitting: Orthopaedic Surgery

## 2019-07-03 ENCOUNTER — Other Ambulatory Visit: Payer: Self-pay

## 2019-07-03 ENCOUNTER — Ambulatory Visit (HOSPITAL_COMMUNITY): Payer: Medicare Other | Admitting: Anesthesiology

## 2019-07-03 DIAGNOSIS — I10 Essential (primary) hypertension: Secondary | ICD-10-CM | POA: Diagnosis not present

## 2019-07-03 DIAGNOSIS — Z96641 Presence of right artificial hip joint: Secondary | ICD-10-CM | POA: Diagnosis not present

## 2019-07-03 DIAGNOSIS — M1712 Unilateral primary osteoarthritis, left knee: Secondary | ICD-10-CM | POA: Diagnosis present

## 2019-07-03 DIAGNOSIS — Z87891 Personal history of nicotine dependence: Secondary | ICD-10-CM | POA: Diagnosis not present

## 2019-07-03 DIAGNOSIS — Z79899 Other long term (current) drug therapy: Secondary | ICD-10-CM | POA: Insufficient documentation

## 2019-07-03 DIAGNOSIS — J449 Chronic obstructive pulmonary disease, unspecified: Secondary | ICD-10-CM | POA: Diagnosis not present

## 2019-07-03 DIAGNOSIS — Z7982 Long term (current) use of aspirin: Secondary | ICD-10-CM | POA: Insufficient documentation

## 2019-07-03 HISTORY — PX: TOTAL KNEE ARTHROPLASTY: SHX125

## 2019-07-03 LAB — TYPE AND SCREEN
ABO/RH(D): O POS
Antibody Screen: NEGATIVE

## 2019-07-03 SURGERY — ARTHROPLASTY, KNEE, TOTAL
Anesthesia: Spinal | Site: Knee | Laterality: Left

## 2019-07-03 MED ORDER — PROMETHAZINE HCL 25 MG/ML IJ SOLN: 6.2500 mg | INTRAMUSCULAR | Status: DC | PRN

## 2019-07-03 MED ORDER — BUPIVACAINE-EPINEPHRINE 0.5% -1:200000 IJ SOLN
INTRAMUSCULAR | Status: DC | PRN
  Administered 2019-07-03: 30 mL

## 2019-07-03 MED ORDER — FENTANYL CITRATE (PF) 100 MCG/2ML IJ SOLN
INTRAMUSCULAR | Status: AC
  Filled 2019-07-03: qty 2

## 2019-07-03 MED ORDER — SODIUM CHLORIDE 0.9% IV SOLUTION
INTRAVENOUS | Status: DC | PRN
  Administered 2019-07-03: 3000 mL via INTRAMUSCULAR

## 2019-07-03 MED ORDER — LACTATED RINGERS IV SOLN: INTRAVENOUS | Status: DC

## 2019-07-03 MED ORDER — PHENYLEPHRINE HCL-NACL 10-0.9 MG/250ML-% IV SOLN
INTRAVENOUS | Status: DC | PRN
  Administered 2019-07-03: 30 ug/min via INTRAVENOUS

## 2019-07-03 MED ORDER — TRANEXAMIC ACID-NACL 1000-0.7 MG/100ML-% IV SOLN
1000.0000 mg | INTRAVENOUS | Status: AC
  Administered 2019-07-03: 1000 mg via INTRAVENOUS
  Filled 2019-07-03: qty 100

## 2019-07-03 MED ORDER — ONDANSETRON HCL 4 MG/2ML IJ SOLN: 4.0000 mg | Freq: Four times a day (QID) | INTRAMUSCULAR | Status: DC | PRN

## 2019-07-03 MED ORDER — ASPIRIN 81 MG PO CHEW
81.0000 mg | CHEWABLE_TABLET | Freq: Two times a day (BID) | ORAL | Status: DC
  Administered 2019-07-04: 81 mg via ORAL
  Filled 2019-07-03: qty 1

## 2019-07-03 MED ORDER — MENTHOL 3 MG MT LOZG: 1.0000 | LOZENGE | OROMUCOSAL | Status: DC | PRN

## 2019-07-03 MED ORDER — FINASTERIDE 5 MG PO TABS
5.0000 mg | ORAL_TABLET | Freq: Every evening | ORAL | Status: DC
  Administered 2019-07-03 – 2019-07-04 (×2): 5 mg via ORAL
  Filled 2019-07-03 (×2): qty 1

## 2019-07-03 MED ORDER — ACETAMINOPHEN 325 MG PO TABS: 325.0000 mg | ORAL_TABLET | Freq: Four times a day (QID) | ORAL | Status: DC | PRN

## 2019-07-03 MED ORDER — BISACODYL 5 MG PO TBEC: 5.0000 mg | DELAYED_RELEASE_TABLET | Freq: Every day | ORAL | Status: DC | PRN

## 2019-07-03 MED ORDER — BUPIVACAINE-EPINEPHRINE 0.5% -1:200000 IJ SOLN
INTRAMUSCULAR | Status: AC
  Filled 2019-07-03: qty 1

## 2019-07-03 MED ORDER — HYDROCODONE-ACETAMINOPHEN 5-325 MG PO TABS: 1.0000 | ORAL_TABLET | ORAL | Status: DC | PRN

## 2019-07-03 MED ORDER — LISINOPRIL 20 MG PO TABS: 20.0000 mg | ORAL_TABLET | Freq: Every evening | ORAL | Status: DC

## 2019-07-03 MED ORDER — TRANEXAMIC ACID 1000 MG/10ML IV SOLN
2000.0000 mg | INTRAVENOUS | Status: DC
  Filled 2019-07-03: qty 20

## 2019-07-03 MED ORDER — DIPHENHYDRAMINE HCL 12.5 MG/5ML PO ELIX: 12.5000 mg | ORAL_SOLUTION | ORAL | Status: DC | PRN

## 2019-07-03 MED ORDER — DOCUSATE SODIUM 100 MG PO CAPS
100.0000 mg | ORAL_CAPSULE | Freq: Two times a day (BID) | ORAL | Status: DC
  Administered 2019-07-03 – 2019-07-04 (×2): 100 mg via ORAL
  Filled 2019-07-03 (×2): qty 1

## 2019-07-03 MED ORDER — DEXAMETHASONE SODIUM PHOSPHATE 10 MG/ML IJ SOLN
INTRAMUSCULAR | Status: DC | PRN
Start: 2019-07-03 — End: 2019-07-03
  Administered 2019-07-03: 5 mg via INTRAVENOUS

## 2019-07-03 MED ORDER — ONDANSETRON HCL 4 MG PO TABS: 4.0000 mg | ORAL_TABLET | Freq: Four times a day (QID) | ORAL | Status: DC | PRN

## 2019-07-03 MED ORDER — PHENYLEPHRINE HCL (PRESSORS) 10 MG/ML IV SOLN
INTRAVENOUS | Status: DC | PRN
  Administered 2019-07-03 (×2): 40 ug via INTRAVENOUS

## 2019-07-03 MED ORDER — 0.9 % SODIUM CHLORIDE (POUR BTL) OPTIME
TOPICAL | Status: DC | PRN
  Administered 2019-07-03: 1000 mL

## 2019-07-03 MED ORDER — PROPOFOL 500 MG/50ML IV EMUL
INTRAVENOUS | Status: AC
  Filled 2019-07-03: qty 150

## 2019-07-03 MED ORDER — PHENOL 1.4 % MT LIQD: 1.0000 | OROMUCOSAL | Status: DC | PRN

## 2019-07-03 MED ORDER — PROPOFOL 1000 MG/100ML IV EMUL
INTRAVENOUS | Status: AC
  Filled 2019-07-03: qty 100

## 2019-07-03 MED ORDER — STERILE WATER FOR IRRIGATION IR SOLN
Status: DC | PRN
  Administered 2019-07-03: 2000 mL

## 2019-07-03 MED ORDER — ACETAMINOPHEN 500 MG PO TABS
1000.0000 mg | ORAL_TABLET | Freq: Once | ORAL | Status: AC
  Administered 2019-07-03: 1000 mg via ORAL
  Filled 2019-07-03: qty 2

## 2019-07-03 MED ORDER — MEPERIDINE HCL 50 MG/ML IJ SOLN: 6.2500 mg | INTRAMUSCULAR | Status: DC | PRN

## 2019-07-03 MED ORDER — PHENYLEPHRINE HCL (PRESSORS) 10 MG/ML IV SOLN
INTRAVENOUS | Status: AC
  Filled 2019-07-03: qty 1

## 2019-07-03 MED ORDER — BUPIVACAINE IN DEXTROSE 0.75-8.25 % IT SOLN
INTRATHECAL | Status: DC | PRN
Start: 2019-07-03 — End: 2019-07-03
  Administered 2019-07-03: 13.5 mg via INTRATHECAL

## 2019-07-03 MED ORDER — HYDROCODONE-ACETAMINOPHEN 7.5-325 MG PO TABS: 1.0000 | ORAL_TABLET | ORAL | Status: DC | PRN

## 2019-07-03 MED ORDER — VANCOMYCIN HCL IN DEXTROSE 1-5 GM/200ML-% IV SOLN
1000.0000 mg | Freq: Two times a day (BID) | INTRAVENOUS | Status: AC
  Administered 2019-07-03: 1000 mg via INTRAVENOUS
  Filled 2019-07-03: qty 200

## 2019-07-03 MED ORDER — MIDAZOLAM HCL 2 MG/2ML IJ SOLN: 0.5000 mg | Freq: Once | INTRAMUSCULAR | Status: DC | PRN

## 2019-07-03 MED ORDER — METOCLOPRAMIDE HCL 5 MG/ML IJ SOLN: 5.0000 mg | Freq: Three times a day (TID) | INTRAMUSCULAR | Status: DC | PRN

## 2019-07-03 MED ORDER — FENTANYL CITRATE (PF) 100 MCG/2ML IJ SOLN
INTRAMUSCULAR | Status: DC | PRN
  Administered 2019-07-03 (×2): 50 ug via INTRAVENOUS

## 2019-07-03 MED ORDER — METHOCARBAMOL 500 MG PO TABS
500.0000 mg | ORAL_TABLET | Freq: Four times a day (QID) | ORAL | Status: DC | PRN
  Administered 2019-07-04: 500 mg via ORAL
  Filled 2019-07-03: qty 1

## 2019-07-03 MED ORDER — ROPIVACAINE HCL 7.5 MG/ML IJ SOLN
INTRAMUSCULAR | Status: DC | PRN
Start: 2019-07-03 — End: 2019-07-03
  Administered 2019-07-03: 20 mL via PERINEURAL

## 2019-07-03 MED ORDER — TRANEXAMIC ACID-NACL 1000-0.7 MG/100ML-% IV SOLN
1000.0000 mg | Freq: Once | INTRAVENOUS | Status: AC
  Administered 2019-07-03: 1000 mg via INTRAVENOUS
  Filled 2019-07-03: qty 100

## 2019-07-03 MED ORDER — SODIUM CHLORIDE (PF) 0.9 % IJ SOLN
INTRAMUSCULAR | Status: AC
  Filled 2019-07-03: qty 50

## 2019-07-03 MED ORDER — CHLORHEXIDINE GLUCONATE 0.12 % MT SOLN
15.0000 mL | Freq: Once | OROMUCOSAL | Status: AC
  Administered 2019-07-03: 15 mL via OROMUCOSAL

## 2019-07-03 MED ORDER — ONDANSETRON HCL 4 MG/2ML IJ SOLN
INTRAMUSCULAR | Status: DC | PRN
  Administered 2019-07-03: 4 mg via INTRAVENOUS

## 2019-07-03 MED ORDER — BUPIVACAINE LIPOSOME 1.3 % IJ SUSP
INTRAMUSCULAR | Status: DC | PRN
  Administered 2019-07-03: 20 mL

## 2019-07-03 MED ORDER — ACETAMINOPHEN 500 MG PO TABS
500.0000 mg | ORAL_TABLET | Freq: Four times a day (QID) | ORAL | Status: AC
  Administered 2019-07-03 – 2019-07-04 (×4): 500 mg via ORAL
  Filled 2019-07-03 (×4): qty 1

## 2019-07-03 MED ORDER — ORAL CARE MOUTH RINSE: 15.0000 mL | Freq: Once | OROMUCOSAL | Status: AC

## 2019-07-03 MED ORDER — VANCOMYCIN HCL IN DEXTROSE 1-5 GM/200ML-% IV SOLN
1000.0000 mg | INTRAVENOUS | Status: AC
  Administered 2019-07-03: 1000 mg via INTRAVENOUS
  Filled 2019-07-03: qty 200

## 2019-07-03 MED ORDER — ATORVASTATIN CALCIUM 10 MG PO TABS
10.0000 mg | ORAL_TABLET | Freq: Every evening | ORAL | Status: DC
  Administered 2019-07-03 – 2019-07-04 (×2): 10 mg via ORAL
  Filled 2019-07-03 (×2): qty 1

## 2019-07-03 MED ORDER — OXYCODONE HCL 5 MG/5ML PO SOLN: 5.0000 mg | Freq: Once | ORAL | Status: DC | PRN

## 2019-07-03 MED ORDER — SODIUM CHLORIDE (PF) 0.9 % IJ SOLN
INTRAMUSCULAR | Status: DC | PRN
  Administered 2019-07-03: 30 mL

## 2019-07-03 MED ORDER — ALUM & MAG HYDROXIDE-SIMETH 200-200-20 MG/5ML PO SUSP: 30.0000 mL | ORAL | Status: DC | PRN

## 2019-07-03 MED ORDER — MORPHINE SULFATE (PF) 4 MG/ML IV SOLN: 0.5000 mg | INTRAVENOUS | Status: DC | PRN

## 2019-07-03 MED ORDER — METOCLOPRAMIDE HCL 5 MG PO TABS: 5.0000 mg | ORAL_TABLET | Freq: Three times a day (TID) | ORAL | Status: DC | PRN

## 2019-07-03 MED ORDER — PROPOFOL 500 MG/50ML IV EMUL
INTRAVENOUS | Status: DC | PRN
  Administered 2019-07-03: 75 ug/kg/min via INTRAVENOUS

## 2019-07-03 MED ORDER — KETOROLAC TROMETHAMINE 15 MG/ML IJ SOLN
7.5000 mg | Freq: Four times a day (QID) | INTRAMUSCULAR | Status: AC
  Administered 2019-07-03 – 2019-07-04 (×4): 7.5 mg via INTRAVENOUS
  Filled 2019-07-03 (×4): qty 1

## 2019-07-03 MED ORDER — TRANEXAMIC ACID 1000 MG/10ML IV SOLN
INTRAVENOUS | Status: DC | PRN
  Administered 2019-07-03: 2000 mg via TOPICAL

## 2019-07-03 MED ORDER — TAMSULOSIN HCL 0.4 MG PO CAPS
0.4000 mg | ORAL_CAPSULE | Freq: Every evening | ORAL | Status: DC
  Administered 2019-07-03 – 2019-07-04 (×2): 0.4 mg via ORAL
  Filled 2019-07-03 (×2): qty 1

## 2019-07-03 MED ORDER — METHOCARBAMOL 500 MG IVPB - SIMPLE MED
500.0000 mg | Freq: Four times a day (QID) | INTRAVENOUS | Status: DC | PRN
  Filled 2019-07-03: qty 50

## 2019-07-03 MED ORDER — HYDROMORPHONE HCL 1 MG/ML IJ SOLN: 0.2500 mg | INTRAMUSCULAR | Status: DC | PRN

## 2019-07-03 MED ORDER — POVIDONE-IODINE 10 % EX SWAB
2.0000 "application " | Freq: Once | CUTANEOUS | Status: AC
  Administered 2019-07-03: 2 via TOPICAL

## 2019-07-03 MED ORDER — EPHEDRINE SULFATE 50 MG/ML IJ SOLN
INTRAMUSCULAR | Status: DC | PRN
  Administered 2019-07-03 (×3): 10 mg via INTRAVENOUS

## 2019-07-03 MED ORDER — PROPOFOL 500 MG/50ML IV EMUL
INTRAVENOUS | Status: DC | PRN
  Administered 2019-07-03: 30 mg via INTRAVENOUS

## 2019-07-03 MED ORDER — OXYCODONE HCL 5 MG PO TABS: 5.0000 mg | ORAL_TABLET | Freq: Once | ORAL | Status: DC | PRN

## 2019-07-03 SURGICAL SUPPLY — 49 items
ATTUNE MED DOME PAT 38 KNEE (Knees) ×2 IMPLANT
ATTUNE PS FEM LT SZ 6 CEM KNEE (Femur) ×2 IMPLANT
ATTUNE PSRP INSR SZ6 7 KNEE (Insert) ×2 IMPLANT
BAG DECANTER FOR FLEXI CONT (MISCELLANEOUS) ×2 IMPLANT
BAG ZIPLOCK 12X15 (MISCELLANEOUS) ×2 IMPLANT
BASE TIBIAL ROT PLAT SZ 8 KNEE (Knees) ×1 IMPLANT
BLADE SAGITTAL 25.0X1.19X90 (BLADE) ×2 IMPLANT
BLADE SAW SGTL 11.0X1.19X90.0M (BLADE) ×2 IMPLANT
BNDG ELASTIC 6X5.8 VLCR STR LF (GAUZE/BANDAGES/DRESSINGS) ×2 IMPLANT
BOOTIES KNEE HIGH SLOAN (MISCELLANEOUS) ×2 IMPLANT
BOWL SMART MIX CTS (DISPOSABLE) ×2 IMPLANT
CEMENT HV SMART SET (Cement) ×4 IMPLANT
COVER SURGICAL LIGHT HANDLE (MISCELLANEOUS) ×2 IMPLANT
COVER WAND RF STERILE (DRAPES) ×2 IMPLANT
CUFF TOURN SGL QUICK 34 (TOURNIQUET CUFF) ×1
CUFF TRNQT CYL 34X4.125X (TOURNIQUET CUFF) ×1 IMPLANT
DECANTER SPIKE VIAL GLASS SM (MISCELLANEOUS) ×4 IMPLANT
DRAPE SHEET LG 3/4 BI-LAMINATE (DRAPES) ×2 IMPLANT
DRAPE TOP 10253 STERILE (DRAPES) ×2 IMPLANT
DRAPE U-SHAPE 47X51 STRL (DRAPES) ×2 IMPLANT
DRSG AQUACEL AG ADV 3.5X10 (GAUZE/BANDAGES/DRESSINGS) ×2 IMPLANT
DURAPREP 26ML APPLICATOR (WOUND CARE) ×4 IMPLANT
ELECT REM PT RETURN 15FT ADLT (MISCELLANEOUS) ×2 IMPLANT
GLOVE BIO SURGEON STRL SZ8 (GLOVE) ×4 IMPLANT
GLOVE BIOGEL PI IND STRL 8 (GLOVE) ×2 IMPLANT
GLOVE BIOGEL PI INDICATOR 8 (GLOVE) ×2
GOWN STRL REUS W/TWL XL LVL3 (GOWN DISPOSABLE) ×4 IMPLANT
HANDPIECE INTERPULSE COAX TIP (DISPOSABLE) ×1
HOLDER FOLEY CATH W/STRAP (MISCELLANEOUS) IMPLANT
HOOD PEEL AWAY FLYTE STAYCOOL (MISCELLANEOUS) ×6 IMPLANT
KIT TURNOVER KIT A (KITS) IMPLANT
MANIFOLD NEPTUNE II (INSTRUMENTS) ×2 IMPLANT
NS IRRIG 1000ML POUR BTL (IV SOLUTION) ×2 IMPLANT
PACK TOTAL KNEE CUSTOM (KITS) ×2 IMPLANT
PAD ARMBOARD 7.5X6 YLW CONV (MISCELLANEOUS) ×2 IMPLANT
PENCIL SMOKE EVACUATOR (MISCELLANEOUS) IMPLANT
PIN DRILL FIX HALF THREAD (BIT) ×2 IMPLANT
PIN STEINMAN FIXATION KNEE (PIN) ×2 IMPLANT
PROTECTOR NERVE ULNAR (MISCELLANEOUS) ×2 IMPLANT
SET HNDPC FAN SPRY TIP SCT (DISPOSABLE) ×1 IMPLANT
SUT ETHIBOND NAB CT1 #1 30IN (SUTURE) ×4 IMPLANT
SUT VIC AB 0 CT1 36 (SUTURE) ×2 IMPLANT
SUT VIC AB 2-0 CT1 27 (SUTURE) ×1
SUT VIC AB 2-0 CT1 TAPERPNT 27 (SUTURE) ×1 IMPLANT
SUT VICRYL AB 3-0 FS1 BRD 27IN (SUTURE) ×2 IMPLANT
TIBIAL BASE ROT PLAT SZ 8 KNEE (Knees) ×2 IMPLANT
TRAY FOLEY MTR SLVR 16FR STAT (SET/KITS/TRAYS/PACK) IMPLANT
WATER STERILE IRR 1000ML POUR (IV SOLUTION) ×2 IMPLANT
WRAP KNEE MAXI GEL POST OP (GAUZE/BANDAGES/DRESSINGS) ×2 IMPLANT

## 2019-07-03 NOTE — Interval H&P Note (Signed)
History and Physical Interval Note:  07/03/2019 7:27 AM  Jason Mosley  has presented today for surgery, with the diagnosis of LEFT KNEE DEGENERATIVE JOINT DISEASE.  The various methods of treatment have been discussed with the patient and family. After consideration of risks, benefits and other options for treatment, the patient has consented to  Procedure(s): LEFT TOTAL KNEE ARTHROPLASTY (Left) as a surgical intervention.  The patient's history has been reviewed, patient examined, no change in status, stable for surgery.  I have reviewed the patient's chart and labs.  Questions were answered to the patient's satisfaction.     Velna Ochs

## 2019-07-03 NOTE — Plan of Care (Signed)
Plan of care reviewed and discussed with the patient. 

## 2019-07-03 NOTE — Evaluation (Signed)
Physical Therapy Evaluation Patient Details Name: Jason Mosley MRN: [id number] DOB: 1950/05/09 Today's Date: 07/03/2019   History of Present Illness  pt with s/p L TKA 07/03/2019 and history of RTHA on 06/2018.  Clinical Impression  Pt is s/p TKA resulting in the deficits listed below (see PT Problem List).  Pt will benefit from acute PT to increase their independence and safety with mobility to allow discharge home with caregiver safely tomorrow. Tolerated first session today POD0 very well, and should be able to DC home tomorrow if all continues to progress well for the patient from our standpoint.      Follow Up Recommendations Follow surgeon's recommendation for DC plan and follow-up therapies (per pt OP set up)    Equipment Recommendations  None recommended by PT    Recommendations for Other Services       Precautions / Restrictions Precautions Precautions: Knee Precaution Comments: discussed postioning of knee when resting sraight and ambulating throughout the day for mobility and ,movment Restrictions Weight Bearing Restrictions: Yes LLE Weight Bearing: Weight bearing as tolerated      Mobility  Bed Mobility Overal bed mobility: Independent                Transfers Overall transfer level: Needs assistance Equipment used: Rolling walker (2 wheeled) Transfers: Sit to/from Stand Sit to Stand: Min guard         General transfer comment: cues for RW safety  Ambulation/Gait Ambulation/Gait assistance: Min guard Gait Distance (Feet): 80 Feet Assistive device: Rolling walker (2 wheeled) Gait Pattern/deviations: Step-to pattern        Stairs            Wheelchair Mobility    Modified Rankin (Stroke Patients Only)       Balance Overall balance assessment: Mild deficits observed, not formally tested;No apparent balance deficits (not formally assessed)                                           Pertinent Vitals/Pain Pain  Assessment: 0-10 Pain Score: 2  Pain Location: very little in L knee anterior area Pain Descriptors / Indicators: Sore Pain Intervention(s): Monitored during session;Ice applied    Home Living Family/patient expects to be discharged to:: Private residence Living Arrangements: Spouse/significant other Available Help at Discharge: Family Type of Home: House Home Access: Level entry     Home Layout: One level Home Equipment: Environmental consultant - 2 wheels      Prior Function Level of Independence: Independent         Comments: very independent and wants to be active again     Hand Dominance        Extremity/Trunk Assessment        Lower Extremity Assessment Lower Extremity Assessment: Overall WFL for tasks assessed       Communication   Communication: No difficulties  Cognition Arousal/Alertness: Awake/alert Behavior During Therapy: WFL for tasks assessed/performed Overall Cognitive Status: Within Functional Limits for tasks assessed                                        General Comments      Exercises Total Joint Exercises Ankle Circles/Pumps: AROM;Supine;Both;10 reps Quad Sets: AROM;Supine;Left;10 reps Heel Slides: AAROM;Supine;Left;5 reps Straight Leg Raises: AAROM;Supine;Left;5 reps Goniometric ROM: 0-90 seated  Assessment/Plan    PT Assessment Patient needs continued PT services  PT Problem List Decreased strength;Decreased mobility;Decreased range of motion;Decreased activity tolerance       PT Treatment Interventions DME instruction;Therapeutic exercise;Gait training;Stair training;Functional mobility training;Therapeutic activities;Patient/family education    PT Goals (Current goals can be found in the Care Plan section)  Acute Rehab PT Goals Patient Stated Goal: I want to be active and be able to play tennis again PT Goal Formulation: With patient Time For Goal Achievement: 07/10/19 Potential to Achieve Goals: Good    Frequency  7X/week   Barriers to discharge        Co-evaluation               AM-PAC PT "6 Clicks" Mobility  Outcome Measure Help needed turning from your back to your side while in a flat bed without using bedrails?: None Help needed moving from lying on your back to sitting on the side of a flat bed without using bedrails?: None Help needed moving to and from a bed to a chair (including a wheelchair)?: A Little Help needed standing up from a chair using your arms (e.g., wheelchair or bedside chair)?: A Little Help needed to walk in hospital room?: A Little Help needed climbing 3-5 steps with a railing? : A Little 6 Click Score: 20    End of Session Equipment Utilized During Treatment: Gait belt Activity Tolerance: Patient tolerated treatment well Patient left: in chair;with call bell/phone within reach;with chair alarm set Nurse Communication: Mobility status PT Visit Diagnosis: Other abnormalities of gait and mobility (R26.89)    Time: 1530-1600 PT Time Calculation (min) (ACUTE ONLY): 30 min   Charges:   PT Evaluation $PT Eval Low Complexity: 1 Low PT Treatments $Gait Training: 8-22 mins        Raelea Gosse, PT, MPT Acute Rehabilitation Services Office: [phone] Pager: [phone] 07/03/2019   Clide Dales 07/03/2019, 4:24 PM

## 2019-07-03 NOTE — Op Note (Signed)
PREOP DIAGNOSIS: DJD LEFT KNEE POSTOP DIAGNOSIS:  same PROCEDURE: LEFT TKR ANESTHESIA: Spinal and MAC ATTENDING SURGEON: Hessie Dibble ASSISTANT: Jason Dolly PA  INDICATIONS FOR PROCEDURE: Jason Mosley is a 69 y.o. male who has struggled for a long time with pain due to degenerative arthritis of the left knee.  The patient has failed many conservative non-operative measures and at this point has pain which limits the ability to sleep and walk.  The patient is offered total knee replacement.  Informed operative consent was obtained after discussion of possible risks of anesthesia, infection, neurovascular injury, DVT, and death.  The importance of the post-operative rehabilitation protocol to optimize result was stressed extensively with the patient.  SUMMARY OF FINDINGS AND PROCEDURE:  Jason Mosley was taken to the operative suite where under the above anesthesia a left knee replacement was performed.  There were advanced degenerative changes and the bone quality was excellent.  We used the DePuyAttune system and placed size 6 femur, 8 tibia, 38 mm all polyethylene patella, and a size 7 mm spacer.  Jason Dolly PA-C assisted throughout and was invaluable to the completion of the case in that he helped retract and maintain exposure while I placed the components.  He also helped close thereby minimizing OR time.  The patient was admitted for appropriate post-op care to include perioperative antibiotics and mechanical and pharmacologic measures for DVT prophylaxis.  DESCRIPTION OF PROCEDURE:  Jason Mosley was taken to the operative suite where the above anesthesia was applied.  The patient was positioned supine and prepped and draped in normal sterile fashion.  An appropriate time out was performed.  After the administration of vancomycin pre-op antibiotic the leg was elevated and exsanguinated and a tourniquet inflated.  A standard longitudinal incision was made on the anterior knee.  Dissection was  carried down to the extensor mechanism.  All appropriate anti-infective measures were used including the pre-operative antibiotic, betadine impregnated drape, and closed hooded exhaust systems for each member of the surgical team.  A medial parapatellar incision was made in the extensor mechanism and the knee cap flipped and the knee flexed.  Some residual meniscal tissues were removed along with any remaining ACL/PCL tissue.  A guide was placed on the tibia and a flat cut was made on it's superior surface.  An intramedullary guide was placed in the femur and was utilized to make anterior and posterior cuts creating an appropriate flexion gap.  A second intramedullary guide was placed in the femur to make a distal cut properly balancing the knee with an extension gap equal to the flexion gap.  The three bones sized to the above mentioned sizes and the appropriate guides were placed and utilized.  A trial reduction was done and the knee easily came to full extension and the patella tracked well on flexion.  The trial components were removed and all bones were cleaned with pulsatile lavage and then dried thoroughly.  Cement was mixed and was pressurized onto the bones followed by placement of the aforementioned components.  Excess cement was trimmed and pressure was held on the components until the cement had hardened.  The tourniquet was deflated and a small amount of bleeding was controlled with cautery and pressure.  The knee was irrigated thoroughly.  The extensor mechanism was re-approximated with #1 ethibond in interrupted fashion.  The knee was flexed and the repair was solid.  The subcutaneous tissues were re-approximated with #0 and #2-0 vicryl and the skin closed with a  subcuticular stitch and steristrips.  A sterile dressing was applied.  Intraoperative fluids, EBL, and tourniquet time can be obtained from anesthesia records.  DISPOSITION:  The patient was taken to recovery room in stable condition and  admitted for appropriate post-op care to include peri-operative antibiotic and DVT prophylaxis with mechanical and pharmacologic measures.  Hessie Dibble 07/03/2019, 9:17 AM

## 2019-07-03 NOTE — Anesthesia Procedure Notes (Signed)
Anesthesia Regional Block: Adductor canal block   Pre-Anesthetic Checklist: ,, timeout performed, Correct Patient, Correct Site, Correct Laterality, Correct Procedure, Correct Position, site marked, Risks and benefits discussed,  Surgical consent,  Pre-op evaluation,  At surgeon's request and post-op pain management  Laterality: Left and Lower  Prep: chloraprep       Needles:  Injection technique: Single-shot  Needle Type: Echogenic Needle     Needle Length: 9cm  Needle Gauge: 21     Additional Needles:   Procedures:,,,, ultrasound used (permanent image in chart),,,,  Narrative:  Start time: 07/03/2019 7:00 AM End time: 07/03/2019 7:06 AM Injection made incrementally with aspirations every 5 mL.  Performed by: Personally  Anesthesiologist: Jairo Ben, MD  Additional Notes: Pt identified in Holding room.  Monitors applied. Working IV access confirmed. Sterile prep L thigh.  #21ga ECHOgenic needle into adductor canal with US guidance.  20cc 0.75% Ropivacaine injected incrementally after negative test dose.  Patient asymptomatic, VSS, no heme aspirated, tolerated well.  Sandford Craze, MD

## 2019-07-03 NOTE — Anesthesia Postprocedure Evaluation (Signed)
Anesthesia Post Note  Patient: Nancy Manuele  Procedure(s) Performed: LEFT TOTAL KNEE ARTHROPLASTY (Left Knee)     Patient location during evaluation: Phase II Anesthesia Type: Spinal Level of consciousness: awake and alert, oriented and patient cooperative Pain management: pain level controlled Vital Signs Assessment: post-procedure vital signs reviewed and stable Respiratory status: spontaneous breathing, nonlabored ventilation and respiratory function stable Cardiovascular status: blood pressure returned to baseline and stable Postop Assessment: no backache, spinal receding, no apparent nausea or vomiting and patient able to bend at knees Anesthetic complications: no   No complications documented.  Last Vitals:  Vitals:   07/03/19 1000 07/03/19 1015  BP: 100/62 109/67  Pulse: 65 63  Resp: 10 16  Temp:  (!) 36.4 C  SpO2: 100% 100%    Last Pain:  Vitals:   07/03/19 1015  TempSrc:   PainSc: 0-No pain                 Tajee Savant,E. Bartholomew Ramesh

## 2019-07-03 NOTE — Anesthesia Procedure Notes (Addendum)
Spinal  Patient location during procedure: OR Start time: 07/05/2019 7:34 AM End time: 07/03/2019 7:41 AM Staffing Performed: anesthesiologist and resident/CRNA  Anesthesiologist: Jairo Ben, MD Resident/CRNA: Kizzie Fantasia, CRNA Preanesthetic Checklist Completed: patient identified, IV checked, site marked, risks and benefits discussed, surgical consent, monitors and equipment checked, pre-op evaluation and timeout performed Spinal Block Patient position: sitting Prep: DuraPrep and site prepped and draped Patient monitoring: blood pressure, continuous pulse ox, cardiac monitor and heart rate Approach: midline Location: L3-4 Injection technique: single-shot Needle Needle type: Pencan and Introducer  Needle gauge: 24 G Needle length: 9 cm Additional Notes Pt identified in Operating room.  Monitors applied. Working IV access confirmed. Sterile prep, drape lumbar spine.  1% lido local L 3,4, buy CRNA, all attempts os, then repeat local and #24ga Pencan into clear CSF L 3,4.  13.5mg  0.75% Bupivacaine with dextrose injected with asp CSF beginning and end of injection.  Patient asymptomatic, VSS, no heme aspirated, tolerated well.  Sandford Craze, MD

## 2019-07-03 NOTE — Transfer of Care (Signed)
Immediate Anesthesia Transfer of Care Note  Patient: Jason Mosley  Procedure(s) Performed: LEFT TOTAL KNEE ARTHROPLASTY (Left Knee)  Patient Location: PACU  Anesthesia Type:Spinal  Level of Consciousness: awake, alert  and oriented  Airway & Oxygen Therapy: Patient Spontanous Breathing and Patient connected to face mask oxygen  Post-op Assessment: Report given to RN and Post -op Vital signs reviewed and stable  Post vital signs: Reviewed and stable  Last Vitals:  Vitals Value Taken Time  BP 102/73 07/03/19 0945  Temp    Pulse 80 07/03/19 0946  Resp 23 07/03/19 0946  SpO2 100 % 07/03/19 0946  Vitals shown include unvalidated device data.  Last Pain:  Vitals:   07/03/19 0551  TempSrc:   PainSc: 0-No pain         Complications: No complications documented.

## 2019-07-04 ENCOUNTER — Encounter (HOSPITAL_COMMUNITY): Payer: Self-pay | Admitting: Orthopaedic Surgery

## 2019-07-04 DIAGNOSIS — M1712 Unilateral primary osteoarthritis, left knee: Secondary | ICD-10-CM | POA: Diagnosis not present

## 2019-07-04 MED ORDER — HYDROCODONE-ACETAMINOPHEN 5-325 MG PO TABS: 1.0000 | ORAL_TABLET | Freq: Four times a day (QID) | ORAL | 0 refills | Status: DC | PRN

## 2019-07-04 MED ORDER — TIZANIDINE HCL 4 MG PO TABS
4.0000 mg | ORAL_TABLET | Freq: Four times a day (QID) | ORAL | 1 refills | Status: DC | PRN
Start: 2019-07-04 — End: 2020-02-13

## 2019-07-04 MED ORDER — ASPIRIN 81 MG PO CHEW: 81.0000 mg | CHEWABLE_TABLET | Freq: Two times a day (BID) | ORAL | 0 refills | Status: DC

## 2019-07-04 MED ORDER — LACTATED RINGERS IV BOLUS
500.0000 mL | Freq: Once | INTRAVENOUS | Status: AC
  Administered 2019-07-04: 500 mL via INTRAVENOUS

## 2019-07-04 NOTE — Discharge Summary (Signed)
Patient ID: Jason Mosley MRN: [id number] DOB/AGE: 09-21-1950 69 y.o.  Admit date: 07/03/2019 Discharge date: 07/04/2019  Admission Diagnoses:  Principal Problem:   Primary osteoarthritis of left knee   Discharge Diagnoses:  Same  Past Medical History:  Diagnosis Date  . Arthritis   . Complication of anesthesia   . COPD (chronic obstructive pulmonary disease) (HCC)    Diagnosised x 20 years ago. No complications since diagnosed  . 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 Hypertension   . PONV (postoperative nausea and vomiting)     Surgeries: Procedure(s): LEFT TOTAL KNEE ARTHROPLASTY on 07/03/2019   Consultants:   Discharged Condition: Improved  Hospital Course: Jason Mosley is an 69 y.o. male who was admitted 07/03/2019 for operative treatment ofPrimary osteoarthritis of left knee. Patient has severe unremitting pain that affects sleep, daily activities, and work/hobbies. After pre-op clearance the patient was taken to the operating room on 07/03/2019 and underwent  Procedure(s): LEFT TOTAL KNEE ARTHROPLASTY.    Patient was given perioperative antibiotics:  Anti-infectives (From admission, onward)   Start     Dose/Rate Route Frequency Ordered Stop   07/03/19 2000  vancomycin (VANCOCIN) IVPB 1000 mg/200 mL premix        1,000 mg 200 mL/hr over 60 Minutes Intravenous Every 12 hours 07/03/19 1312 07/03/19 2105   07/03/19 0600  vancomycin (VANCOCIN) IVPB 1000 mg/200 mL premix        1,000 mg 200 mL/hr over 60 Minutes Intravenous On call to O.R. 07/03/19 1324 07/03/19 0809       Patient was given sequential compression devices, early ambulation, and chemoprophylaxis to prevent DVT.  Patient benefited maximally from hospital stay and there were no complications.    Recent vital signs:  Patient Vitals for the past 24 hrs:  BP Temp Temp src Pulse Resp SpO2 Height Weight  07/04/19 0604 116/78 98.3 F (36.8 C) Oral 75 16 98 % -- --  07/04/19 0206 110/69 98.3 F (36.8 C)  Oral 64 16 99 % -- --  07/03/19 2205 124/68 97.6 F (36.4 C) Oral 66 16 97 % -- --  07/03/19 1727 112/66 98.3 F (36.8 C) Oral 68 16 99 % -- --  07/03/19 1358 124/78 97.9 F (36.6 C) Oral 61 15 100 % -- --  07/03/19 1310 130/80 97.8 F (36.6 C) Oral 60 16 100 %  (1.778 m) 86.6 kg  07/03/19 1300 124/77 (!) 97.5 F (36.4 C) -- 62 18 100 % -- --  07/03/19 1200 123/65 (!) 97.5 F (36.4 C) -- (!) 52 18 100 % -- --  07/03/19 1100 116/74 (!) 97.5 F (36.4 C) -- (!) 51 16 100 % -- --  07/03/19 1015 109/67 (!) 97.5 F (36.4 C) -- 63 16 100 % -- --  07/03/19 1000 100/62 -- -- 65 10 100 % -- --  07/03/19 0946 102/73 (!) 97.5 F (36.4 C) -- 75 10 100 % -- --     Recent laboratory studies: No results for input(s): WBC, HGB, HCT, PLT, NA, K, CL, CO2, BUN, CREATININE, GLUCOSE, INR, CALCIUM in the last 72 hours.  Invalid input(s): PT, 2   Discharge Medications:   Allergies as of 07/04/2019      Reactions   Penicillins Itching   Did it involve swelling of the face/tongue/throat, SOB, or low BP? No Did it involve sudden or severe rash/hives, skin peeling, or any reaction on the inside of your mouth or nose? No Did you need  to seek medical attention at a hospital or doctor's office? No When did it last happen?10 years ago If all above answers are "NO", may proceed with cephalosporin use.      Medication List    STOP taking these medications   aspirin 325 MG EC tablet Replaced by: aspirin 81 MG chewable tablet     TAKE these medications   acetaminophen 650 MG CR tablet Commonly known as: TYLENOL Take 1,300 mg by mouth every 8 (eight) hours as needed for pain.   aspirin 81 MG chewable tablet Chew 1 tablet (81 mg total) by mouth 2 (two) times daily. Replaces: aspirin 325 MG EC tablet   atorvastatin 10 MG tablet Commonly known as: LIPITOR Take 10 mg by mouth every evening.   cholecalciferol 25 MCG (1000 UNIT) tablet Commonly known as: VITAMIN D3 Take 1,000 Units by  mouth daily.   diclofenac sodium 1 % Gel Commonly known as: VOLTAREN Apply 1 application topically in the morning, at noon, and at bedtime.   finasteride 5 MG tablet Commonly known as: PROSCAR Take 5 mg by mouth every evening.   HYDROcodone-acetaminophen 5-325 MG tablet Commonly known as: NORCO/VICODIN Take 1-2 tablets by mouth every 6 (six) hours as needed for moderate pain or severe pain (post op pain). What changed: reasons to take this   lisinopril 20 MG tablet Commonly known as: ZESTRIL Take 20 mg by mouth every evening.   tamsulosin 0.4 MG Caps capsule Commonly known as: FLOMAX Take 0.4 mg by mouth every evening.   tiZANidine 4 MG tablet Commonly known as: Zanaflex Take 1 tablet (4 mg total) by mouth every 6 (six) hours as needed for muscle spasms.   vitamin B-12 1000 MCG tablet Commonly known as: CYANOCOBALAMIN Take 1,000 mcg by mouth daily.            Durable Medical Equipment  (From admission, onward)         Start     Ordered   07/03/19 1313  DME Walker rolling  Once       Question:  Patient needs a walker to treat with the following condition  Answer:  Primary osteoarthritis of left knee   07/03/19 1312   07/03/19 1313  DME 3 n 1  Once        07/03/19 1312   07/03/19 1313  DME Bedside commode  Once       Question:  Patient needs a bedside commode to treat with the following condition  Answer:  Primary osteoarthritis of left knee   07/03/19 1312          Diagnostic Studies: DG Chest 2 View  Result Date: 06/26/2019 CLINICAL DATA:  Preoperative study. EXAM: CHEST - 2 VIEW COMPARISON:  06/07/2018. FINDINGS: Mediastinum and hilar structures normal. Heart size normal. Mild left base subsegmental atelectasis. No focal alveolar infiltrate. Minimal left base pleural thickening consistent with scarring or minimal left pleural effusion. Pectus deformity. Diffuse degenerative change thoracic spine. IMPRESSION: Mild left base subsegmental atelectasis. Minimal  left base pleural thickening consistent with scarring or minimal left pleural effusion. Electronically Signed   By: Maisie Fus  Register   On: 06/26/2019 09:10    Disposition: Discharge disposition: 01-Home or Self Care       Discharge Instructions    Call MD / Call 911   Complete by: As directed    If you experience chest pain or shortness of breath, CALL 911 and be transported to the hospital emergency room.  If you develope a  fever above 101 F, pus (white drainage) or increased drainage or redness at the wound, or calf pain, call your surgeon's office.   Constipation Prevention   Complete by: As directed    Drink plenty of fluids.  Prune juice may be helpful.  You may use a stool softener, such as Colace (over the counter) 100 mg twice a day.  Use MiraLax (over the counter) for constipation as needed.   Diet - low sodium heart healthy   Complete by: As directed    Discharge instructions   Complete by: As directed    INSTRUCTIONS AFTER JOINT REPLACEMENT   Remove items at home which could result in a fall. This includes throw rugs or furniture in walking pathways ICE to the affected joint every three hours while awake for 30 minutes at a time, for at least the first 3-5 days, and then as needed for pain and swelling.  Continue to use ice for pain and swelling. You may notice swelling that will progress down to the foot and ankle.  This is normal after surgery.  Elevate your leg when you are not up walking on it.   Continue to use the breathing machine you got in the hospital (incentive spirometer) which will help keep your temperature down.  It is common for your temperature to cycle up and down following surgery, especially at night when you are not up moving around and exerting yourself.  The breathing machine keeps your lungs expanded and your temperature down.   DIET:  As you were doing prior to hospitalization, we recommend a well-balanced diet.  DRESSING / WOUND CARE /  SHOWERING  You may shower 3 days after surgery, but keep the wounds dry during showering.  You may use an occlusive plastic wrap (Press'n Seal for example), NO SOAKING/SUBMERGING IN THE BATHTUB.  If the bandage gets wet, change with a clean dry gauze.  If the incision gets wet, pat the wound dry with a clean towel.  ACTIVITY  Increase activity slowly as tolerated, but follow the weight bearing instructions below.   No driving for 6 weeks or until further direction given by your physician.  You cannot drive while taking narcotics.  No lifting or carrying greater than 10 lbs. until further directed by your surgeon. Avoid periods of inactivity such as sitting longer than an hour when not asleep. This helps prevent blood clots.  You may return to work once you are authorized by your doctor.     WEIGHT BEARING   Weight bearing as tolerated with assist device (walker, cane, etc) as directed, use it as long as suggested by your surgeon or therapist, typically at least 4-6 weeks.   EXERCISES  Results after joint replacement surgery are often greatly improved when you follow the exercise, range of motion and muscle strengthening exercises prescribed by your doctor. Safety measures are also important to protect the joint from further injury. Any time any of these exercises cause you to have increased pain or swelling, decrease what you are doing until you are comfortable again and then slowly increase them. If you have problems or questions, call your caregiver or physical therapist for advice.   Rehabilitation is important following a joint replacement. After just a few days of immobilization, the muscles of the leg can become weakened and shrink (atrophy).  These exercises are designed to build up the tone and strength of the thigh and leg muscles and to improve motion. Often times heat used for twenty to  thirty minutes before working out will loosen up your tissues and help with improving the range  of motion but do not use heat for the first two weeks following surgery (sometimes heat can increase post-operative swelling).   These exercises can be done on a training (exercise) mat, on the floor, on a table or on a bed. Use whatever works the best and is most comfortable for you.    Use music or television while you are exercising so that the exercises are a pleasant break in your day. This will make your life better with the exercises acting as a break in your routine that you can look forward to.   Perform all exercises about fifteen times, three times per day or as directed.  You should exercise both the operative leg and the other leg as well.  Exercises include:   Quad Sets - Tighten up the muscle on the front of the thigh (Quad) and hold for 5-10 seconds.   Straight Leg Raises - With your knee straight (if you were given a brace, keep it on), lift the leg to 60 degrees, hold for 3 seconds, and slowly lower the leg.  Perform this exercise against resistance later as your leg gets stronger.  Leg Slides: Lying on your back, slowly slide your foot toward your buttocks, bending your knee up off the floor (only go as far as is comfortable). Then slowly slide your foot back down until your leg is flat on the floor again.  Angel Wings: Lying on your back spread your legs to the side as far apart as you can without causing discomfort.  Hamstring Strength:  Lying on your back, push your heel against the floor with your leg straight by tightening up the muscles of your buttocks.  Repeat, but this time bend your knee to a comfortable angle, and push your heel against the floor.  You may put a pillow under the heel to make it more comfortable if necessary.   A rehabilitation program following joint replacement surgery can speed recovery and prevent re-injury in the future due to weakened muscles. Contact your doctor or a physical therapist for more information on knee rehabilitation.     CONSTIPATION  Constipation is defined medically as fewer than three stools per week and severe constipation as less than one stool per week.  Even if you have a regular bowel pattern at home, your normal regimen is likely to be disrupted due to multiple reasons following surgery.  Combination of anesthesia, postoperative narcotics, change in appetite and fluid intake all can affect your bowels.   YOU MUST use at least one of the following options; they are listed in order of increasing strength to get the job done.  They are all available over the counter, and you may need to use some, POSSIBLY even all of these options:    Drink plenty of fluids (prune juice may be helpful) and high fiber foods Colace 100 mg by mouth twice a day  Senokot for constipation as directed and as needed Dulcolax (bisacodyl), take with full glass of water  Miralax (polyethylene glycol) once or twice a day as needed.  If you have tried all these things and are unable to have a bowel movement in the first 3-4 days after surgery call either your surgeon or your primary doctor.    If you experience loose stools or diarrhea, hold the medications until you stool forms back up.  If your symptoms do not get better  within 1 week or if they get worse, check with your doctor.  If you experience "the worst abdominal pain ever" or develop nausea or vomiting, please contact the office immediately for further recommendations for treatment.   ITCHING:  If you experience itching with your medications, try taking only a single pain pill, or even half a pain pill at a time.  You can also use Benadryl over the counter for itching or also to help with sleep.   TED HOSE STOCKINGS:  Use stockings on both legs until for at least 2 weeks or as directed by physician office. They may be removed at night for sleeping.  MEDICATIONS:  See your medication summary on the "After Visit Summary" that nursing will review with you.  You may have some  home medications which will be placed on hold until you complete the course of blood thinner medication.  It is important for you to complete the blood thinner medication as prescribed.  PRECAUTIONS:  If you experience chest pain or shortness of breath - call 911 immediately for transfer to the hospital emergency department.   If you develop a fever greater that 101 F, purulent drainage from wound, increased redness or drainage from wound, foul odor from the wound/dressing, or calf pain - CONTACT YOUR SURGEON.                                                   FOLLOW-UP APPOINTMENTS:  If you do not already have a post-op appointment, please call the office for an appointment to be seen by your surgeon.  Guidelines for how soon to be seen are listed in your "After Visit Summary", but are typically between 1-4 weeks after surgery.  OTHER INSTRUCTIONS:   Knee Replacement:  Do not place pillow under knee, focus on keeping the knee straight while resting. CPM instructions: 0-90 degrees, 2 hours in the morning, 2 hours in the afternoon, and 2 hours in the evening. Place foam block, curve side up under heel at all times except when in CPM or when walking.  DO NOT modify, tear, cut, or change the foam block in any way.   DENTAL ANTIBIOTICS:  In most cases prophylactic antibiotics for Dental procdeures after total joint surgery are not necessary.  Exceptions are as follows:  1. History of prior total joint infection  2. Severely immunocompromised (Organ Transplant, cancer chemotherapy, Rheumatoid biologic meds such as Humera)  3. Poorly controlled diabetes (A1C &gt; 8.0, blood glucose over 200)  If you have one of these conditions, contact your surgeon for an antibiotic prescription, prior to your dental procedure.   MAKE SURE YOU:  Understand these instructions.  Get help right away if you are not doing well or get worse.    Thank you for letting us be a part of your medical care team.  It  is a privilege we respect greatly.  We hope these instructions will help you stay on track for a fast and full recovery!   Increase activity slowly as tolerated   Complete by: As directed        Follow-up Information    Marcene Corningalldorf, Peter, MD. Schedule an appointment as soon as possible for a visit in 2 weeks.   Specialty: Orthopedic Surgery Contact information: [address] [place] [id number] [phone]  Signed: Ginger Organ Rhiley Tarver 07/04/2019, 7:50 AM

## 2019-07-04 NOTE — Progress Notes (Signed)
Physical Therapy Treatment Patient Details Name: Jason Mosley MRN: [id number] DOB: [birth year] Today's Date: 07/04/2019    History of Present Illness pt with s/p L TKA 07/03/2019 and history of RTHA on 06/2018.    PT Comments    POD # 1 am session Assisted with amb in hallway Then returned to room to perform some TE's following HEP handout.  Instructed on proper tech, freq as well as use of ICE.   Addressed all mobility questions, discussed appropriate activity, educated on use of ICE.  Pt ready for D/C to home.   Follow Up Recommendations  Follow surgeon's recommendation for DC plan and follow-up therapies     Equipment Recommendations  None recommended by PT    Recommendations for Other Services       Precautions / Restrictions Precautions Precautions: Knee Precaution Comments: instructed no pillow under knee Restrictions Weight Bearing Restrictions: No LLE Weight Bearing: Weight bearing as tolerated    Mobility  Bed Mobility               General bed mobility comments: pt seated EOB on arrival  Transfers Overall transfer level: Needs assistance Equipment used: Rolling walker (2 wheeled) Transfers: Sit to/from Stand Sit to Stand: Supervision         General transfer comment: safety/impulsive  Ambulation/Gait Ambulation/Gait assistance: Supervision Gait Distance (Feet): 125 Feet Assistive device: Rolling walker (2 wheeled) Gait Pattern/deviations: Step-to pattern;Decreased stance time - left Gait velocity: WFL   General Gait Details: functional distance and WNL gait speed.  25% VC's on safety with turns.   Stairs Stairs:  (no stairs)           Wheelchair Mobility    Modified Rankin (Stroke Patients Only)       Balance                                            Cognition Arousal/Alertness: Awake/alert Behavior During Therapy: WFL for tasks assessed/performed Overall Cognitive Status: Within Functional Limits for  tasks assessed                                        Exercises   Total Knee Replacement TE's following HEP handout 10 reps B LE ankle pumps 05 reps towel squeezes 05 reps knee presses 05 reps heel slides  05 reps SAQ's 05 reps SLR's 05 reps ABD Educated on use of gait belt to assist with TE's Followed by ICE     General Comments        Pertinent Vitals/Pain Pain Assessment: Faces Faces Pain Scale: Hurts a little bit Pain Location: very little in L knee anterior area Pain Descriptors / Indicators: Sore Pain Intervention(s): Monitored during session;Premedicated before session;Ice applied;Repositioned    Home Living                      Prior Function            PT Goals (current goals can now be found in the care plan section) Progress towards PT goals: Progressing toward goals    Frequency    7X/week      PT Plan Current plan remains appropriate    Co-evaluation              AM-PAC PT "6  Clicks" Mobility   Outcome Measure  Help needed turning from your back to your side while in a flat bed without using bedrails?: None Help needed moving from lying on your back to sitting on the side of a flat bed without using bedrails?: None Help needed moving to and from a bed to a chair (including a wheelchair)?: A Little Help needed standing up from a chair using your arms (e.g., wheelchair or bedside chair)?: A Little Help needed to walk in hospital room?: A Little Help needed climbing 3-5 steps with a railing? : A Little 6 Click Score: 20    End of Session Equipment Utilized During Treatment: Gait belt Activity Tolerance: Patient tolerated treatment well Patient left: in chair;with call bell/phone within reach;with chair alarm set Nurse Communication: Mobility status (pt ready for D/C but has yet to void) PT Visit Diagnosis: Other abnormalities of gait and mobility (R26.89)     Time: 1150-1220 PT Time Calculation (min) (ACUTE  ONLY): 30 min  Charges:  $Gait Training: 8-22 mins $Therapeutic Exercise: 8-22 mins                     Rica Koyanagi  PTA Acute  Rehabilitation Services Pager      [phone] Office      [phone]

## 2019-07-04 NOTE — Progress Notes (Signed)
Subjective: 1 Day Post-Op Procedure(s) (LRB): LEFT TOTAL KNEE ARTHROPLASTY (Left)   Patient feels well and is looking forward to going home.  Activity level:  wbat Diet tolerance:  ok Voiding:  ok Patient reports pain as mild.    Objective: Vital signs in last 24 hours: Temp:  [97.5 F (36.4 C)-98.3 F (36.8 C)] 98.3 F (36.8 C) (06/23 0604) Pulse Rate:  [51-75] 75 (06/23 0604) Resp:  [10-18] 16 (06/23 0604) BP: (100-130)/(62-80) 116/78 (06/23 0604) SpO2:  [97 %-100 %] 98 % (06/23 0604) Weight:  [86.6 kg] 86.6 kg (06/22 1310)  Labs: No results for input(s): HGB in the last 72 hours. No results for input(s): WBC, RBC, HCT, PLT in the last 72 hours. No results for input(s): NA, K, CL, CO2, BUN, CREATININE, GLUCOSE, CALCIUM in the last 72 hours. No results for input(s): LABPT, INR in the last 72 hours.  Physical Exam:  Neurologically intact ABD soft Neurovascular intact Sensation intact distally Intact pulses distally Dorsiflexion/Plantar flexion intact Incision: dressing C/D/I and no drainage No cellulitis present Compartment soft  Assessment/Plan:  1 Day Post-Op Procedure(s) (LRB): LEFT TOTAL KNEE ARTHROPLASTY (Left) Advance diet Up with therapy D/C IV fluids Discharge home with home health today after PT. Follow up in office 2 weeks post op. Continue on 81mg  asa BID x 2 weeks post op for DVT prevention.    Branton Einstein 07/04/2019, 7:46 AM

## 2019-07-04 NOTE — TOC Transition Note (Signed)
Transition of Care [facility]) - CM/SW Discharge Note   Patient Details  Name: Jason Mosley MRN: [id number] Date of Birth: 27-Dec-1950  [facility] CM/SW Contact:  Clearance Coots, LCSW Phone Number: 07/04/2019, 12:23 PM   Clinical Narrative:    Prearranged Therapy Plan in the [facility]. Patient has DME.    Final next level of care: Home w Home Health Services Barriers to Discharge: No Barriers Identified   Patient Goals and CMS Choice   CMS Medicare.gov Compare Post Acute Care list provided to:: Patient (Prearranged in Orthopedic office) Choice offered to / list presented to : Patient  Discharge Placement                       Discharge Plan and Services                DME Arranged: N/A         HH Arranged: PT HH Agency: [facility]        Social Determinants of Health (SDOH) Interventions     Readmission Risk Interventions No flowsheet data found.

## 2019-07-05 NOTE — Addendum Note (Signed)
Addendum  created 07/05/19 1236 by Jairo Ben, MD   Clinical Note Signed, Intraprocedure Blocks edited

## 2020-02-01 ENCOUNTER — Other Ambulatory Visit: Payer: Self-pay | Admitting: Orthopaedic Surgery

## 2020-02-01 DIAGNOSIS — Z01818 Encounter for other preprocedural examination: Secondary | ICD-10-CM

## 2020-02-04 ENCOUNTER — Other Ambulatory Visit: Payer: Self-pay | Admitting: Orthopaedic Surgery

## 2020-02-21 NOTE — Patient Instructions (Addendum)
DUE TO COVID-19 ONLY ONE VISITOR IS ALLOWED TO COME WITH YOU AND STAY IN THE WAITING ROOM ONLY DURING PRE OP AND PROCEDURE DAY OF SURGERY. THE 1 VISITOR  MAY VISIT WITH YOU AFTER SURGERY IN YOUR PRIVATE ROOM DURING VISITING HOURS ONLY!                Jason Mosley   Your procedure is scheduled on: 02/26/20   Report to [facility] Main  Entrance   Report to admitting at : 7:30 AM     Call this number if you have problems the morning of surgery [phone]    Remember:  NO SOLID FOOD AFTER MIDNIGHT THE NIGHT PRIOR TO SURGERY. NOTHING BY MOUTH EXCEPT CLEAR LIQUIDS UNTIL: 7:00 AM . PLEASE FINISH ENSURE DRINK PER SURGEON ORDER  WHICH NEEDS TO BE COMPLETED AT: 7:00 AM .  CLEAR LIQUID DIET  Foods Allowed                                                                     Foods Excluded  Coffee and tea, regular and decaf                             liquids that you cannot  Plain Jell-O any favor except red or purple                                           see through such as: Fruit ices (not with fruit pulp)                                     milk, soups, orange juice  Iced Popsicles                                    All solid food Carbonated beverages, regular and diet                                    Cranberry, grape and apple juices Sports drinks like Gatorade Lightly seasoned clear broth or consume(fat free) Sugar, honey syrup  Sample Menu Breakfast                                Lunch                                     Supper Cranberry juice                    Beef broth                            Chicken broth Jell-O  Grape juice                           Apple juice Coffee or tea                        Jell-O                                      Popsicle                                                Coffee or tea                        Coffee or tea  _____________________________________________________________________    BRUSH YOUR TEETH MORNING OF SURGERY AND RINSE YOUR MOUTH OUT, NO CHEWING GUM CANDY OR MINTS.    Take these medicines the morning of surgery with A SIP OF WATER: metoprolol.                               You may not have any metal on your body including hair pins and              piercings  Do not wear jewelry, lotions, powders or perfumes, deodorant             Men may shave face and neck.   Do not bring valuables to the hospital. [facility] IS NOT             RESPONSIBLE   FOR VALUABLES.  Contacts, dentures or bridgework may not be worn into surgery.  Leave suitcase in the car. After surgery it may be brought to your room.     Patients discharged the day of surgery will not be allowed to drive home. IF YOU ARE HAVING SURGERY AND GOING HOME THE SAME DAY, YOU MUST HAVE AN ADULT TO DRIVE YOU HOME AND BE WITH YOU FOR 24 HOURS. YOU MAY GO HOME BY TAXI OR UBER OR ORTHERWISE, BUT AN ADULT MUST ACCOMPANY YOU HOME AND STAY WITH YOU FOR 24 HOURS.  Name and phone number of your driver:  Special Instructions: N/A              Please read over the following fact sheets you were given: ____________________________________________________________________          [facility] - Preparing for Surgery Before surgery, you can play an important role.  Because skin is not sterile, your skin needs to be as free of germs as possible.  You can reduce the number of germs on your skin by washing with CHG (chlorahexidine gluconate) soap before surgery.  CHG is an antiseptic cleaner which kills germs and bonds with the skin to continue killing germs even after washing. Please DO NOT use if you have an allergy to CHG or antibacterial soaps.  If your skin becomes reddened/irritated stop using the CHG and inform your nurse when you arrive at Short Stay. Do not shave (including legs and underarms) for at least 48 hours prior to the first CHG shower.  You may shave your face/neck. Please follow these instructions  carefully:  1.  Shower with CHG Soap the night before surgery and the  morning of Surgery.  2.  If you choose to wash your hair, wash your hair first as usual with your  normal  shampoo.  3.  After you shampoo, rinse your hair and body thoroughly to remove the  shampoo.                           4.  Use CHG as you would any other liquid soap.  You can apply chg directly  to the skin and wash                       Gently with a scrungie or clean washcloth.  5.  Apply the CHG Soap to your body ONLY FROM THE NECK DOWN.   Do not use on face/ open                           Wound or open sores. Avoid contact with eyes, ears mouth and genitals (private parts).                       Wash face,  Genitals (private parts) with your normal soap.             6.  Wash thoroughly, paying special attention to the area where your surgery  will be performed.  7.  Thoroughly rinse your body with warm water from the neck down.  8.  DO NOT shower/wash with your normal soap after using and rinsing off  the CHG Soap.                9.  Pat yourself dry with a clean towel.            10.  Wear clean pajamas.            11.  Place clean sheets on your bed the night of your first shower and do not  sleep with pets. Day of Surgery : Do not apply any lotions/deodorants the morning of surgery.  Please wear clean clothes to the hospital/surgery center.  FAILURE TO FOLLOW THESE INSTRUCTIONS MAY RESULT IN THE CANCELLATION OF YOUR SURGERY PATIENT SIGNATURE_________________________________  NURSE SIGNATURE__________________________________  ________________________________________________________________________   Jason Mosley  An incentive spirometer is a tool that can help keep your lungs clear and active. This tool measures how well you are filling your lungs with each breath. Taking long deep breaths may help reverse or decrease the chance of developing breathing (pulmonary) problems (especially infection)  following:  A long period of time when you are unable to move or be active. BEFORE THE PROCEDURE   If the spirometer includes an indicator to show your best effort, your nurse or respiratory therapist will set it to a desired goal.  If possible, sit up straight or lean slightly forward. Try not to slouch.  Hold the incentive spirometer in an upright position. INSTRUCTIONS FOR USE  1. Sit on the edge of your bed if possible, or sit up as far as you can in bed or on a chair. 2. Hold the incentive spirometer in an upright position. 3. Breathe out normally. 4. Place the mouthpiece in your mouth and seal your lips tightly around it. 5. Breathe in slowly and as deeply as possible, raising the piston or the ball toward the top of the column.  6. Hold your breath for 3-5 seconds or for as long as possible. Allow the piston or ball to fall to the bottom of the column. 7. Remove the mouthpiece from your mouth and breathe out normally. 8. Rest for a few seconds and repeat Steps 1 through 7 at least 10 times every 1-2 hours when you are awake. Take your time and take a few normal breaths between deep breaths. 9. The spirometer may include an indicator to show your best effort. Use the indicator as a goal to work toward during each repetition. 10. After each set of 10 deep breaths, practice coughing to be sure your lungs are clear. If you have an incision (the cut made at the time of surgery), support your incision when coughing by placing a pillow or rolled up towels firmly against it. Once you are able to get out of bed, walk around indoors and cough well. You may stop using the incentive spirometer when instructed by your caregiver.  RISKS AND COMPLICATIONS  Take your time so you do not get dizzy or light-headed.  If you are in pain, you may need to take or ask for pain medication before doing incentive spirometry. It is harder to take a deep breath if you are having pain. AFTER USE  Rest and  breathe slowly and easily.  It can be helpful to keep track of a log of your progress. Your caregiver can provide you with a simple table to help with this. If you are using the spirometer at home, follow these instructions: SEEK MEDICAL CARE IF:   You are having difficultly using the spirometer.  You have trouble using the spirometer as often as instructed.  Your pain medication is not giving enough relief while using the spirometer.  You develop fever of 100.5 F (38.1 C) or higher. SEEK IMMEDIATE MEDICAL CARE IF:   You cough up bloody sputum that had not been present before.  You develop fever of 102 F (38.9 C) or greater.  You develop worsening pain at or near the incision site. MAKE SURE YOU:   Understand these instructions.  Will watch your condition.  Will get help right away if you are not doing well or get worse. Document Released: 05/10/2006 Document Revised: 03/22/2011 Document Reviewed: 07/11/2006 [facility] Patient Information 2014 [place], [place].   ________________________________________________________________________

## 2020-02-22 ENCOUNTER — Encounter (HOSPITAL_COMMUNITY): Payer: Self-pay

## 2020-02-22 ENCOUNTER — Ambulatory Visit (HOSPITAL_COMMUNITY)
Admission: RE | Admit: 2020-02-22 | Discharge: 2020-02-22 | Disposition: A | Discharge status: Home / Self Care | Payer: Medicare Other | Source: Ambulatory Visit | Attending: Orthopaedic Surgery | Admitting: Orthopaedic Surgery

## 2020-02-22 ENCOUNTER — Other Ambulatory Visit (HOSPITAL_COMMUNITY): Payer: Medicare Other

## 2020-02-22 ENCOUNTER — Encounter (HOSPITAL_COMMUNITY)
Admission: RE | Admit: 2020-02-22 | Discharge: 2020-02-22 | Disposition: A | Discharge status: Home / Self Care | Payer: Medicare Other | Source: Ambulatory Visit | Attending: Orthopaedic Surgery | Admitting: Orthopaedic Surgery

## 2020-02-22 ENCOUNTER — Other Ambulatory Visit: Payer: Self-pay

## 2020-02-22 DIAGNOSIS — Z01818 Encounter for other preprocedural examination: Secondary | ICD-10-CM | POA: Diagnosis not present

## 2020-02-22 LAB — CBC WITH DIFFERENTIAL/PLATELET
Abs Immature Granulocytes: 0.02 10*3/uL (ref 0.00–0.07)
Basophils Absolute: 0 10*3/uL (ref 0.0–0.1)
Basophils Relative: 1 %
Eosinophils Absolute: 0.1 10*3/uL (ref 0.0–0.5)
Eosinophils Relative: 1 %
HCT: 44.7 % (ref 39.0–52.0)
Hemoglobin: 14.6 g/dL (ref 13.0–17.0)
Immature Granulocytes: 0 %
Lymphocytes Relative: 22 %
Lymphs Abs: 1.2 10*3/uL (ref 0.7–4.0)
MCH: 32 pg (ref 26.0–34.0)
MCHC: 32.7 g/dL (ref 30.0–36.0)
MCV: 98 fL (ref 80.0–100.0)
Monocytes Absolute: 0.5 10*3/uL (ref 0.1–1.0)
Monocytes Relative: 9 %
Neutro Abs: 3.7 10*3/uL (ref 1.7–7.7)
Neutrophils Relative %: 67 %
Platelets: 218 10*3/uL (ref 150–400)
RBC: 4.56 MIL/uL (ref 4.22–5.81)
RDW: 12.6 % (ref 11.5–15.5)
WBC: 5.5 10*3/uL (ref 4.0–10.5)
nRBC: 0 % (ref 0.0–0.2)

## 2020-02-22 LAB — SURGICAL PCR SCREEN
MRSA, PCR: NEGATIVE
Staphylococcus aureus: NEGATIVE

## 2020-02-22 LAB — BASIC METABOLIC PANEL
Anion gap: 9 (ref 5–15)
BUN: 18 mg/dL (ref 8–23)
CO2: 27 mmol/L (ref 22–32)
Calcium: 9.2 mg/dL (ref 8.9–10.3)
Chloride: 104 mmol/L (ref 98–111)
Creatinine, Ser: 1.19 mg/dL (ref 0.61–1.24)
GFR, Estimated: >60 mL/min (ref >60)
Glucose, Bld: 108 mg/dL — ABNORMAL HIGH (ref 70–99)
Potassium: 4.7 mmol/L (ref 3.5–5.1)
Sodium: 140 mmol/L (ref 135–145)

## 2020-02-22 LAB — URINALYSIS, ROUTINE W REFLEX MICROSCOPIC
Bilirubin Urine: NEGATIVE
Glucose, UA: NEGATIVE mg/dL
Hgb urine dipstick: NEGATIVE
Ketones, ur: NEGATIVE mg/dL
Leukocytes,Ua: NEGATIVE
Nitrite: NEGATIVE
Protein, ur: NEGATIVE mg/dL
Specific Gravity, Urine: 1.009 (ref 1.005–1.030)
pH: 5 (ref 5.0–8.0)

## 2020-02-22 LAB — PROTIME-INR
INR: 1 (ref 0.8–1.2)
Prothrombin Time: 12.8 seconds (ref 11.4–15.2)

## 2020-02-22 LAB — APTT: aPTT: 30 seconds (ref 24–36)

## 2020-02-22 NOTE — H&P (Signed)
TOTAL KNEE ADMISSION H&P  Patient is being admitted for right total knee arthroplasty.  Subjective:  Chief Complaint:right knee pain.  HPI: Jason Mosley, 70 y.o. male, has a history of pain and functional disability in the right knee due to arthritis and has failed non-surgical conservative treatments for greater than 12 weeks to includeNSAID's and/or analgesics, corticosteriod injections, viscosupplementation injections, flexibility and strengthening excercises, supervised PT with diminished ADL's post treatment, use of assistive devices, weight reduction as appropriate and activity modification.  Onset of symptoms was gradual, starting 5 years ago with gradually worsening course since that time. The patient noted no past surgery on the right knee(s).  Patient currently rates pain in the right knee(s) at 10 out of 10 with activity. Patient has night pain, worsening of pain with activity and weight bearing, pain that interferes with activities of daily living, crepitus and joint swelling.  Patient has evidence of subchondral cysts, subchondral sclerosis, periarticular osteophytes and joint space narrowing by imaging studies. There is no active infection.  Patient Active Problem List   Diagnosis Date Noted  . Primary osteoarthritis of left knee 07/03/2019  . Primary localized osteoarthritis of right hip 06/13/2018  . Primary osteoarthritis of right hip 06/13/2018   Past Medical History:  Diagnosis Date  . Arthritis   . Complication of anesthesia   . COPD (chronic obstructive pulmonary disease) (HCC)    Diagnosised x 20 years ago. No complications since diagnosed  . 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 Hypertension   . PONV (postoperative nausea and vomiting)     Past Surgical History:  Procedure Laterality Date  . FOOT SURGERY    . HERNIA REPAIR     Inguinal  . TOTAL HIP ARTHROPLASTY Right 06/13/2018   Procedure: TOTAL HIP ARTHROPLASTY ANTERIOR APPROACH;  Surgeon: Marcene Corning,  MD;  Location: [facility] ORS;  Service: Orthopedics;  Laterality: Right;  . TOTAL KNEE ARTHROPLASTY Left 07/03/2019   Procedure: LEFT TOTAL KNEE ARTHROPLASTY;  Surgeon: Marcene Corning, MD;  Location: [facility] ORS;  Service: Orthopedics;  Laterality: Left;    No current facility-administered medications for this encounter.   Current Outpatient Medications  Medication Sig Dispense Refill Last Dose  . acetaminophen (TYLENOL) 650 MG CR tablet Take 650 mg by mouth every 8 (eight) hours as needed for pain.     Marland Kitchen atorvastatin (LIPITOR) 10 MG tablet Take 10 mg by mouth every evening.     . bisacodyl (DULCOLAX) 5 MG EC tablet Take 10-15 mg by mouth at bedtime as needed for moderate constipation.     . Cholecalciferol (DIALYVITE VITAMIN D 5000) 125 MCG (5000 UT) capsule Take 5,000 Units by mouth daily.     . Cyanocobalamin (B-12) 3000 MCG CAPS Take 3,000 mcg by mouth daily.     . diclofenac sodium (VOLTAREN) 1 % GEL Apply 1 application topically 3 (three) times daily as needed (pain).     . finasteride (PROSCAR) 5 MG tablet Take 5 mg by mouth every evening.     . metoprolol tartrate (LOPRESSOR) 25 MG tablet Take 25 mg by mouth 2 (two) times daily.     . tamsulosin (FLOMAX) 0.4 MG CAPS capsule Take 0.4 mg by mouth every evening.      Allergies  Allergen Reactions  . Penicillins Itching    Did it involve swelling of the face/tongue/throat, SOB, or low BP? No Did it involve sudden or severe rash/hives, skin peeling, or any reaction on the inside of your mouth or nose?  No Did you need to seek medical attention at a hospital or doctor's office? No When did it last happen?10 years ago If all above answers are "NO", may proceed with cephalosporin use.     Social History   Tobacco Use  . Smoking status: Former Smoker    Packs/day: 3.00    Years: 50.00    Pack years: 150.00    Types: Cigarettes    Quit date: 06/25/2007    Years since quitting: 12.6  . Smokeless tobacco: Never Used  . Tobacco comment:  closet smoker  Substance Use Topics  . Alcohol use: Yes    Alcohol/week: 6.0 standard drinks    Types: 6 Glasses of wine per week    Comment: Fridays /Saturday weekly    No family history on file.   Review of Systems  Musculoskeletal:       Right knee  All other systems reviewed and are negative.   Objective:  Physical Exam Constitutional:      Appearance: Normal appearance.  HENT:     Head: Normocephalic and atraumatic.     Nose: Nose normal.     Mouth/Throat:     Pharynx: Oropharynx is clear.  Eyes:     Extraocular Movements: Extraocular movements intact.  Cardiovascular:     Rate and Rhythm: Normal rate and regular rhythm.  Pulmonary:     Effort: Pulmonary effort is normal.  Abdominal:     Palpations: Abdomen is soft.  Musculoskeletal:     Comments: Examination of the left knee shows range of motion from 0-120 of flexion.  No real effusion.  Good stability with varus and valgus stressing.  His calf is soft and nontender.  He is neurovascularly intact distally.  Examination of the right knee shows range of motion from 0-120 of flexion.  He has a mild to moderate valgus deformity.  His ligaments are stable.  His calf is soft and nontender.  He is neurovascularly intact distally.    Skin:    General: Skin is warm and dry.  Neurological:     General: No focal deficit present.     Mental Status: He is alert and oriented to person, place, and time.  Psychiatric:        Mood and Affect: Mood normal.        Behavior: Behavior normal.        Thought Content: Thought content normal.        Judgment: Judgment normal.     Vital signs in last 24 hours: Temp:  [97.9 F (36.6 C)] 97.9 F (36.6 C) (02/11 0922) Pulse Rate:  [57] 57 (02/11 0922) BP: (139)/(91) 139/91 (02/11 0922) SpO2:  [99 %] 99 % (02/11 0922) Weight:  [96.6 kg] 96.6 kg (02/11 0922)  Labs:   Estimated body mass index is 29.71 kg/m as calculated from the following:   Height as of 02/22/20: 5\' 11"   (1.803 m).   Weight as of 02/22/20: 96.6 kg.   Imaging Review Plain radiographs demonstrate severe degenerative joint disease of the right knee(s). The overall alignment isneutral. The bone quality appears to be good for age and reported activity level.      Assessment/Plan:  End stage primary arthritis, right knee   The patient history, physical examination, clinical judgment of the provider and imaging studies are consistent with end stage degenerative joint disease of the right knee(s) and total knee arthroplasty is deemed medically necessary. The treatment options including medical management, injection therapy arthroscopy and arthroplasty were  discussed at length. The risks and benefits of total knee arthroplasty were presented and reviewed. The risks due to aseptic loosening, infection, stiffness, patella tracking problems, thromboembolic complications and other imponderables were discussed. The patient acknowledged the explanation, agreed to proceed with the plan and consent was signed. Patient is being admitted for inpatient treatment for surgery, pain control, PT, OT, prophylactic antibiotics, VTE prophylaxis, progressive ambulation and ADL's and discharge planning. The patient is planning to be discharged home with home health services     Patient's anticipated LOS is less than 2 midnights, meeting these requirements: - Younger than 63 - Lives within 1 hour of care - Has a competent adult at home to recover with post-op recover - NO history of  - Chronic pain requiring opiods  - Diabetes  - Coronary Artery Disease  - Heart failure  - Heart attack  - Stroke  - DVT/VTE  - Cardiac arrhythmia  - Respiratory Failure/COPD  - Renal failure  - Anemia  - Advanced Liver disease

## 2020-02-22 NOTE — Progress Notes (Addendum)
COVID Vaccine Completed: Yes Date COVID Vaccine completed: 03/2019  COVID vaccine manufacturer:     Moderna    PCP - Dr. Romeo Rabon Cardiologist - Dr. Altamease Oiler  Chest x-ray -  EKG -  Stress Test -  ECHO -  Cardiac Cath -  Pacemaker/ICD device last checked:  Sleep Study - YES CPAP - NO  Fasting Blood Sugar -  Checks Blood Sugar _____ times a day  Blood Thinner Instructions: Aspirin Instructions: Last Dose:  Anesthesia review: Hx: HTN,OSA(NO CPAP),BBB  Patient denies shortness of breath, fever, cough and chest pain at PAT appointment   Patient verbalized understanding of instructions that were given to them at the PAT appointment. Patient was also instructed that they will need to review over the PAT instructions again at home before surgery.

## 2020-02-25 MED ORDER — BUPIVACAINE LIPOSOME 1.3 % IJ SUSP
20.0000 mL | INTRAMUSCULAR | Status: DC
  Filled 2020-02-25: qty 20

## 2020-02-25 MED ORDER — TRANEXAMIC ACID 1000 MG/10ML IV SOLN
2000.0000 mg | INTRAVENOUS | Status: DC
  Filled 2020-02-25: qty 20

## 2020-02-26 ENCOUNTER — Ambulatory Visit (HOSPITAL_COMMUNITY): Payer: Medicare Other | Admitting: Anesthesiology

## 2020-02-26 ENCOUNTER — Ambulatory Visit (HOSPITAL_COMMUNITY)
Admission: RE | Admit: 2020-02-26 | Discharge: 2020-02-26 | Disposition: A | Discharge status: Home / Self Care | Payer: Medicare Other | Attending: Orthopaedic Surgery | Admitting: Orthopaedic Surgery

## 2020-02-26 ENCOUNTER — Ambulatory Visit (HOSPITAL_COMMUNITY): Payer: Medicare Other | Admitting: Physician Assistant

## 2020-02-26 ENCOUNTER — Encounter (HOSPITAL_COMMUNITY)
Admission: RE | Disposition: A | Discharge status: Home / Self Care | Payer: Self-pay | Source: Home / Self Care | Attending: Orthopaedic Surgery

## 2020-02-26 ENCOUNTER — Encounter (HOSPITAL_COMMUNITY): Payer: Self-pay | Admitting: Orthopaedic Surgery

## 2020-02-26 DIAGNOSIS — Z880 Allergy status to penicillin: Secondary | ICD-10-CM | POA: Insufficient documentation

## 2020-02-26 DIAGNOSIS — M1711 Unilateral primary osteoarthritis, right knee: Secondary | ICD-10-CM | POA: Diagnosis present

## 2020-02-26 DIAGNOSIS — Z96652 Presence of left artificial knee joint: Secondary | ICD-10-CM | POA: Diagnosis not present

## 2020-02-26 DIAGNOSIS — Z79899 Other long term (current) drug therapy: Secondary | ICD-10-CM | POA: Insufficient documentation

## 2020-02-26 DIAGNOSIS — Z96641 Presence of right artificial hip joint: Secondary | ICD-10-CM | POA: Insufficient documentation

## 2020-02-26 DIAGNOSIS — Z87891 Personal history of nicotine dependence: Secondary | ICD-10-CM | POA: Diagnosis not present

## 2020-02-26 HISTORY — PX: TOTAL KNEE ARTHROPLASTY: SHX125

## 2020-02-26 LAB — TYPE AND SCREEN
ABO/RH(D): O POS
Antibody Screen: NEGATIVE

## 2020-02-26 SURGERY — ARTHROPLASTY, KNEE, TOTAL
Anesthesia: Regional | Site: Knee | Laterality: Right

## 2020-02-26 MED ORDER — ONDANSETRON HCL 4 MG/2ML IJ SOLN
INTRAMUSCULAR | Status: DC | PRN
  Administered 2020-02-26: 4 mg via INTRAVENOUS

## 2020-02-26 MED ORDER — PHENYLEPHRINE HCL-NACL 10-0.9 MG/250ML-% IV SOLN
INTRAVENOUS | Status: DC | PRN
  Administered 2020-02-26: 25 ug/min via INTRAVENOUS

## 2020-02-26 MED ORDER — CEFAZOLIN SODIUM-DEXTROSE 2-3 GM-%(50ML) IV SOLR
INTRAVENOUS | Status: DC | PRN
  Administered 2020-02-26: 2 g via INTRAVENOUS

## 2020-02-26 MED ORDER — BUPIVACAINE IN DEXTROSE 0.75-8.25 % IT SOLN
INTRATHECAL | Status: DC | PRN
  Administered 2020-02-26: 1.6 mL via INTRATHECAL

## 2020-02-26 MED ORDER — TRANEXAMIC ACID-NACL 1000-0.7 MG/100ML-% IV SOLN: 1000.0000 mg | Freq: Once | INTRAVENOUS | Status: DC

## 2020-02-26 MED ORDER — ONDANSETRON HCL 4 MG/2ML IJ SOLN: 4.0000 mg | Freq: Once | INTRAMUSCULAR | Status: DC | PRN

## 2020-02-26 MED ORDER — PROPOFOL 10 MG/ML IV BOLUS
INTRAVENOUS | Status: DC | PRN
  Administered 2020-02-26: 30 mg via INTRAVENOUS
  Administered 2020-02-26: 20 mg via INTRAVENOUS

## 2020-02-26 MED ORDER — EPHEDRINE 5 MG/ML INJ
INTRAVENOUS | Status: AC
  Filled 2020-02-26: qty 10

## 2020-02-26 MED ORDER — BUPIVACAINE-MELOXICAM ER 400-12 MG/14ML IJ SOLN
INTRAMUSCULAR | Status: AC
  Filled 2020-02-26: qty 1

## 2020-02-26 MED ORDER — HYDROCODONE-ACETAMINOPHEN 5-325 MG PO TABS: 1.0000 | ORAL_TABLET | Freq: Four times a day (QID) | ORAL | 0 refills | Status: AC | PRN

## 2020-02-26 MED ORDER — FENTANYL CITRATE (PF) 100 MCG/2ML IJ SOLN: 25.0000 ug | INTRAMUSCULAR | Status: DC | PRN

## 2020-02-26 MED ORDER — LACTATED RINGERS IV BOLUS
500.0000 mL | Freq: Once | INTRAVENOUS | Status: AC
  Administered 2020-02-26: 500 mL via INTRAVENOUS

## 2020-02-26 MED ORDER — ORAL CARE MOUTH RINSE: 15.0000 mL | Freq: Once | OROMUCOSAL | Status: AC

## 2020-02-26 MED ORDER — TIZANIDINE HCL 4 MG PO TABS: 4.0000 mg | ORAL_TABLET | Freq: Four times a day (QID) | ORAL | 1 refills | Status: AC | PRN

## 2020-02-26 MED ORDER — LACTATED RINGERS IV SOLN: INTRAVENOUS | Status: DC

## 2020-02-26 MED ORDER — DEXAMETHASONE SODIUM PHOSPHATE 10 MG/ML IJ SOLN
INTRAMUSCULAR | Status: DC | PRN
  Administered 2020-02-26: 4 mg via INTRAVENOUS

## 2020-02-26 MED ORDER — ACETAMINOPHEN 500 MG PO TABS
1000.0000 mg | ORAL_TABLET | Freq: Once | ORAL | Status: AC
  Administered 2020-02-26: 1000 mg via ORAL
  Filled 2020-02-26: qty 2

## 2020-02-26 MED ORDER — CEFAZOLIN SODIUM-DEXTROSE 2-4 GM/100ML-% IV SOLN: 2.0000 g | Freq: Four times a day (QID) | INTRAVENOUS | Status: DC

## 2020-02-26 MED ORDER — METHOCARBAMOL 500 MG IVPB - SIMPLE MED: 500.0000 mg | Freq: Four times a day (QID) | INTRAVENOUS | Status: DC | PRN

## 2020-02-26 MED ORDER — MIDAZOLAM HCL 2 MG/2ML IJ SOLN: 1.0000 mg | Freq: Once | INTRAMUSCULAR | Status: DC

## 2020-02-26 MED ORDER — METHOCARBAMOL 500 MG PO TABS: 500.0000 mg | ORAL_TABLET | Freq: Four times a day (QID) | ORAL | Status: DC | PRN

## 2020-02-26 MED ORDER — TRANEXAMIC ACID 1000 MG/10ML IV SOLN
INTRAVENOUS | Status: DC | PRN
  Administered 2020-02-26: 2000 mg via TOPICAL

## 2020-02-26 MED ORDER — CEFAZOLIN SODIUM-DEXTROSE 2-4 GM/100ML-% IV SOLN
INTRAVENOUS | Status: AC
  Filled 2020-02-26: qty 100

## 2020-02-26 MED ORDER — BUPIVACAINE-MELOXICAM ER 400-12 MG/14ML IJ SOLN
INTRAMUSCULAR | Status: DC | PRN
  Administered 2020-02-26: 400 mg

## 2020-02-26 MED ORDER — FENTANYL CITRATE (PF) 100 MCG/2ML IJ SOLN: 50.0000 ug | Freq: Once | INTRAMUSCULAR | Status: AC

## 2020-02-26 MED ORDER — 0.9 % SODIUM CHLORIDE (POUR BTL) OPTIME
TOPICAL | Status: DC | PRN
  Administered 2020-02-26: 1000 mL

## 2020-02-26 MED ORDER — MIDAZOLAM HCL 2 MG/2ML IJ SOLN
INTRAMUSCULAR | Status: AC
  Filled 2020-02-26: qty 2

## 2020-02-26 MED ORDER — ASPIRIN EC 81 MG PO TBEC: 81.0000 mg | DELAYED_RELEASE_TABLET | Freq: Two times a day (BID) | ORAL | 0 refills | Status: AC

## 2020-02-26 MED ORDER — LACTATED RINGERS IV BOLUS: 250.0000 mL | Freq: Once | INTRAVENOUS | Status: DC

## 2020-02-26 MED ORDER — EPHEDRINE SULFATE-NACL 50-0.9 MG/10ML-% IV SOSY
PREFILLED_SYRINGE | INTRAVENOUS | Status: DC | PRN
  Administered 2020-02-26 (×4): 5 mg via INTRAVENOUS
  Administered 2020-02-26: 10 mg via INTRAVENOUS
  Administered 2020-02-26 (×2): 5 mg via INTRAVENOUS

## 2020-02-26 MED ORDER — BUPIVACAINE HCL (PF) 0.25 % IJ SOLN
INTRAMUSCULAR | Status: DC | PRN
  Administered 2020-02-26: 20 mL

## 2020-02-26 MED ORDER — METOPROLOL TARTRATE 50 MG PO TABS
50.0000 mg | ORAL_TABLET | Freq: Once | ORAL | Status: AC
  Administered 2020-02-26: 50 mg via ORAL
  Filled 2020-02-26 (×2): qty 1

## 2020-02-26 MED ORDER — VANCOMYCIN HCL IN DEXTROSE 1-5 GM/200ML-% IV SOLN
1000.0000 mg | INTRAVENOUS | Status: AC
  Administered 2020-02-26: 1000 mg via INTRAVENOUS
  Filled 2020-02-26: qty 200

## 2020-02-26 MED ORDER — LACTATED RINGERS IV BOLUS
250.0000 mL | Freq: Once | INTRAVENOUS | Status: AC
  Administered 2020-02-26: 250 mL via INTRAVENOUS

## 2020-02-26 MED ORDER — TRANEXAMIC ACID-NACL 1000-0.7 MG/100ML-% IV SOLN
1000.0000 mg | INTRAVENOUS | Status: AC
  Administered 2020-02-26: 1000 mg via INTRAVENOUS
  Filled 2020-02-26: qty 100

## 2020-02-26 MED ORDER — PROPOFOL 500 MG/50ML IV EMUL
INTRAVENOUS | Status: DC | PRN
  Administered 2020-02-26: 75 ug/kg/min via INTRAVENOUS

## 2020-02-26 MED ORDER — FENTANYL CITRATE (PF) 100 MCG/2ML IJ SOLN
INTRAMUSCULAR | Status: AC
  Administered 2020-02-26: 50 ug via INTRAVENOUS
  Filled 2020-02-26: qty 2

## 2020-02-26 MED ORDER — CHLORHEXIDINE GLUCONATE 0.12 % MT SOLN
15.0000 mL | Freq: Once | OROMUCOSAL | Status: AC
  Administered 2020-02-26: 15 mL via OROMUCOSAL

## 2020-02-26 MED ORDER — STERILE WATER FOR IRRIGATION IR SOLN
Status: DC | PRN
  Administered 2020-02-26: 2000 mL

## 2020-02-26 MED ORDER — POVIDONE-IODINE 10 % EX SWAB
2.0000 "application " | Freq: Once | CUTANEOUS | Status: AC
  Administered 2020-02-26: 2 via TOPICAL

## 2020-02-26 SURGICAL SUPPLY — 54 items
ATTUNE MED DOME PAT 38 KNEE (Knees) ×2 IMPLANT
ATTUNE PS FEM RT SZ 7 CEM KNEE (Femur) ×2 IMPLANT
ATTUNE PSRP INSR SZ7 7 KNEE (Insert) ×2 IMPLANT
BAG DECANTER FOR FLEXI CONT (MISCELLANEOUS) ×2 IMPLANT
BAG ZIPLOCK 12X15 (MISCELLANEOUS) ×2 IMPLANT
BASE TIBIAL ROT PLAT SZ 8 KNEE (Knees) ×1 IMPLANT
BLADE SAGITTAL 25.0X1.19X90 (BLADE) ×2 IMPLANT
BLADE SAW SGTL 11.0X1.19X90.0M (BLADE) ×2 IMPLANT
BNDG ELASTIC 6X10 VLCR STRL LF (GAUZE/BANDAGES/DRESSINGS) ×2 IMPLANT
BNDG ELASTIC 6X5.8 VLCR STR LF (GAUZE/BANDAGES/DRESSINGS) ×2 IMPLANT
BOOTIES KNEE HIGH SLOAN (MISCELLANEOUS) ×2 IMPLANT
BOWL SMART MIX CTS (DISPOSABLE) ×2 IMPLANT
CEMENT HV SMART SET (Cement) ×4 IMPLANT
COVER SURGICAL LIGHT HANDLE (MISCELLANEOUS) ×2 IMPLANT
COVER WAND RF STERILE (DRAPES) ×2 IMPLANT
CUFF TOURN SGL QUICK 34 (TOURNIQUET CUFF) ×1
CUFF TRNQT CYL 34X4.125X (TOURNIQUET CUFF) ×1 IMPLANT
DECANTER SPIKE VIAL GLASS SM (MISCELLANEOUS) ×4 IMPLANT
DRAPE ORTHO SPLIT 77X108 STRL (DRAPES)
DRAPE SHEET LG 3/4 BI-LAMINATE (DRAPES) ×2 IMPLANT
DRAPE SURG ORHT 6 SPLT 77X108 (DRAPES) IMPLANT
DRAPE TOP 10253 STERILE (DRAPES) ×2 IMPLANT
DRAPE U-SHAPE 47X51 STRL (DRAPES) ×2 IMPLANT
DRESSING AQUACEL AG SP 3.5X10 (GAUZE/BANDAGES/DRESSINGS) ×1 IMPLANT
DRSG AQUACEL AG ADV 3.5X10 (GAUZE/BANDAGES/DRESSINGS) ×2 IMPLANT
DRSG AQUACEL AG SP 3.5X10 (GAUZE/BANDAGES/DRESSINGS) ×2
DURAPREP 26ML APPLICATOR (WOUND CARE) ×4 IMPLANT
ELECT REM PT RETURN 15FT ADLT (MISCELLANEOUS) ×2 IMPLANT
GLOVE SRG 8 PF TXTR STRL LF DI (GLOVE) ×2 IMPLANT
GLOVE SURG ENC MOIS LTX SZ8 (GLOVE) ×4 IMPLANT
GLOVE SURG UNDER POLY LF SZ8 (GLOVE) ×2
GOWN STRL REUS W/TWL XL LVL3 (GOWN DISPOSABLE) ×4 IMPLANT
HANDPIECE INTERPULSE COAX TIP (DISPOSABLE) ×1
HOLDER FOLEY CATH W/STRAP (MISCELLANEOUS) IMPLANT
HOOD PEEL AWAY FLYTE STAYCOOL (MISCELLANEOUS) ×6 IMPLANT
KIT TURNOVER KIT A (KITS) ×2 IMPLANT
MANIFOLD NEPTUNE II (INSTRUMENTS) ×2 IMPLANT
NS IRRIG 1000ML POUR BTL (IV SOLUTION) ×2 IMPLANT
PACK TOTAL KNEE CUSTOM (KITS) ×2 IMPLANT
PAD ARMBOARD 7.5X6 YLW CONV (MISCELLANEOUS) ×2 IMPLANT
PENCIL SMOKE EVACUATOR (MISCELLANEOUS) IMPLANT
PIN DRILL FIX HALF THREAD (BIT) ×2 IMPLANT
PIN STEINMAN FIXATION KNEE (PIN) ×2 IMPLANT
PROTECTOR NERVE ULNAR (MISCELLANEOUS) ×2 IMPLANT
SET HNDPC FAN SPRY TIP SCT (DISPOSABLE) ×1 IMPLANT
SUT ETHIBOND NAB CT1 #1 30IN (SUTURE) ×4 IMPLANT
SUT VIC AB 0 CT1 36 (SUTURE) ×2 IMPLANT
SUT VIC AB 2-0 CT1 27 (SUTURE) ×1
SUT VIC AB 2-0 CT1 TAPERPNT 27 (SUTURE) ×1 IMPLANT
SUT VICRYL AB 3-0 FS1 BRD 27IN (SUTURE) ×2 IMPLANT
TIBIAL BASE ROT PLAT SZ 8 KNEE (Knees) ×2 IMPLANT
TRAY FOLEY MTR SLVR 16FR STAT (SET/KITS/TRAYS/PACK) IMPLANT
WATER STERILE IRR 1000ML POUR (IV SOLUTION) ×2 IMPLANT
WRAP KNEE MAXI GEL POST OP (GAUZE/BANDAGES/DRESSINGS) ×2 IMPLANT

## 2020-02-26 NOTE — Op Note (Signed)
PREOP DIAGNOSIS: DJD RIGHT KNEE POSTOP DIAGNOSIS: same PROCEDURE: RIGHT TKR ANESTHESIA: Spinal and MAC ATTENDING SURGEON: Hessie Dibble ASSISTANT: Loni Dolly PA  INDICATIONS FOR PROCEDURE: Jason Mosley is a 70 y.o. male who has struggled for a long time with pain due to degenerative arthritis of the right knee.  The patient has failed many conservative non-operative measures and at this point has pain which limits the ability to sleep and walk.  The patient is offered total knee replacement.  Informed operative consent was obtained after discussion of possible risks of anesthesia, infection, neurovascular injury, DVT, and death.  The importance of the post-operative rehabilitation protocol to optimize result was stressed extensively with the patient.  SUMMARY OF FINDINGS AND PROCEDURE:  Jason Mosley was taken to the operative suite where under the above anesthesia a right knee replacement was performed.  There were advanced degenerative changes and the bone quality was excellent.  We used the DePuy Attune system and placed size 7 femur, 8 tibia, 38 mm all polyethylene patella, and a size 7 mm spacer.  Loni Dolly PA-C assisted throughout and was invaluable to the completion of the case in that he helped retract and maintain exposure while I placed components.  He also helped close thereby minimizing OR time.  The patient was admitted for appropriate post-op care to include perioperative antibiotics and mechanical and pharmacologic measures for DVT prophylaxis.  DESCRIPTION OF PROCEDURE:  Jason Mosley was taken to the operative suite where the above anesthesia was applied.  The patient was positioned supine and prepped and draped in normal sterile fashion.  An appropriate time out was performed.  After the administration of kefzol and vancomycin pre-op antibiotic the leg was elevated and exsanguinated and a tourniquet inflated. A standard longitudinal incision was made on the anterior knee.   Dissection was carried down to the extensor mechanism.  All appropriate anti-infective measures were used including the pre-operative antibiotic, betadine impregnated drape, and closed hooded exhaust systems for each member of the surgical team.  A medial parapatellar incision was made in the extensor mechanism and the knee cap flipped and the knee flexed.  Some residual meniscal tissues were removed along with any remaining ACL/PCL tissue.  A guide was placed on the tibia and a flat cut was made on it's superior surface.  An intramedullary guide was placed in the femur and was utilized to make anterior and posterior cuts creating an appropriate flexion gap.  A second intramedullary guide was placed in the femur to make a distal cut properly balancing the knee with an extension gap equal to the flexion gap.  The three bones sized to the above mentioned sizes and the appropriate guides were placed and utilized.  A trial reduction was done and the knee easily came to full extension and the patella tracked well on flexion.  The trial components were removed and all bones were cleaned with pulsatile lavage and then dried thoroughly.  Cement was mixed and was pressurized onto the bones followed by placement of the aforementioned components.  Excess cement was trimmed and pressure was held on the components until the cement had hardened.  The tourniquet was deflated and a small amount of bleeding was controlled with cautery and pressure.  The knee was irrigated thoroughly.  The extensor mechanism was re-approximated with #1 ethibond in interrupted fashion.  The knee was flexed and the repair was solid.  The subcutaneous tissues were re-approximated with #0 and #2-0 vicryl and the skin closed with a  subcuticular stitch and steristrips.  A sterile dressing was applied.  Intraoperative fluids, EBL, and tourniquet time can be obtained from anesthesia records.  DISPOSITION:  The patient was taken to recovery room in stable  condition and admitted for appropriate post-op care to include peri-operative antibiotic and DVT prophylaxis with mechanical and pharmacologic measures.  Hessie Dibble 02/26/2020, 11:37 AM

## 2020-02-26 NOTE — Transfer of Care (Signed)
Immediate Anesthesia Transfer of Care Note  Patient: Jason Mosley  Procedure(s) Performed: Procedure(s): RIGHT TOTAL KNEE ARTHROPLASTY (Right)  Patient Location: PACU  Anesthesia Type:GA combined with regional for post-op pain  Level of Consciousness:  sedated, patient cooperative and responds to stimulation  Airway & Oxygen Therapy:Patient Spontanous Breathing and Patient connected to face mask oxgen  Post-op Assessment:  Report given to PACU RN and Post -op Vital signs reviewed and stable  Post vital signs:  Reviewed and stable  Last Vitals:  Vitals:   02/26/20 0915 02/26/20 1200  BP: 134/80 (!) 95/40  Pulse: (!) 54   Resp: 14 18  Temp:    SpO2: 99%     Complications: No apparent anesthesia complications

## 2020-02-26 NOTE — Evaluation (Signed)
Physical Therapy Evaluation Patient Details Name: Jason Mosley MRN: [id number] DOB: September 30, 1950 Today's Date: 02/26/2020   History of Present Illness  Pt is 70 yo male s/p R TKA on 02/26/20.  Pt with PMH including L TKA 07/03/19 and R THA 6/20.  Clinical Impression  Pt is seen in PACU after R TKA for possible same day discharge.  Pt is alert and with sensation intact.  He reports feeling great and no pain.  Pt also familiar with recovery post joint replacement due prior TKA and THA.  Pt demonstrated safe gait and transfers. He demonstrates mobility necessary for return home with family but will benefit from further therapy to advance independence if remains hospitalized.  He presents with deficits listed below (see PT Problem List).     Follow Up Recommendations Follow surgeon's recommendation for DC plan and follow-up therapies;Supervision for mobility/OOB    Equipment Recommendations  None recommended by PT    Recommendations for Other Services       Precautions / Restrictions Precautions Precautions: Fall      Mobility  Bed Mobility Overal bed mobility: Needs Assistance Bed Mobility: Supine to Sit;Sit to Supine     Supine to sit: Supervision Sit to supine: Supervision        Transfers Overall transfer level: Needs assistance Equipment used: Rolling walker (2 wheeled) Transfers: Sit to/from Stand Sit to Stand: Min guard         General transfer comment: min guard for safety but pt able to perform  easily and safely  Ambulation/Gait Ambulation/Gait assistance: Min guard Gait Distance (Feet): 125 Feet Assistive device: Rolling walker (2 wheeled) Gait Pattern/deviations: Step-to pattern;Decreased stride length Gait velocity: decreased   General Gait Details: Step to right gait, steady balance, good RW proximity  Stairs            Wheelchair Mobility    Modified Rankin (Stroke Patients Only)       Balance Overall balance assessment: Needs  assistance Sitting-balance support: No upper extremity supported Sitting balance-Leahy Scale: Normal     Standing balance support: No upper extremity supported;Bilateral upper extremity supported Standing balance-Leahy Scale: Fair Standing balance comment: RW for ambulation, no UE support static stand                             Pertinent Vitals/Pain Pain Assessment: No/denies pain    Home Living Family/patient expects to be discharged to:: Private residence Living Arrangements: Spouse/significant other Available Help at Discharge: Family Type of Home: House Home Access: Level entry     Home Layout: One level Home Equipment: Toilet riser Additional Comments: rents hospital bed for a month    Prior Function Level of Independence: Independent         Comments: 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  Pt likes to play tennis and walk     Hand Dominance        Extremity/Trunk Assessment   Upper Extremity Assessment Upper Extremity Assessment: Overall WFL for tasks assessed    Lower Extremity Assessment Lower Extremity Assessment: RLE deficits/detail RLE Deficits / Details: ROM: R knee 0 to 90 degrees comfortably, hip and ankle WFL; MMT: Ankle 5/5, knee and hip 3/5 not further tested RLE Sensation: WNL    Cervical / Trunk Assessment Cervical / Trunk Assessment: Normal  Communication   Communication: No difficulties  Cognition Arousal/Alertness: Awake/alert Behavior During Therapy: WFL for tasks assessed/performed Overall Cognitive Status: Within Functional Limits for tasks assessed  General Comments General comments (skin integrity, edema, etc.): Pt educated on resting with leg straight, safe ice use, no pivots, and HEP.  Pt familiar with rehab process due to recent L TKA    Exercises Total Joint Exercises Ankle Circles/Pumps: AROM;10 reps;Both Quad Sets: AROM;Both;5 reps;Supine Towel  Squeeze: AROM;Both;5 reps;Supine Heel Slides: AROM;Right;5 reps;Supine Hip ABduction/ADduction: AROM;Right;5 reps;Supine Long Arc Quad: AROM;5 reps;Supine;Right Knee Flexion: AROM;Right;5 reps;Supine Goniometric ROM: R knee 0 to 90   Assessment/Plan    PT Assessment Patient needs continued PT services  PT Problem List Decreased strength;Decreased mobility;Decreased range of motion;Decreased activity tolerance;Decreased balance;Decreased knowledge of use of DME;Pain       PT Treatment Interventions DME instruction;Therapeutic activities;Gait training;Therapeutic exercise;Patient/family education;Modalities;Balance training;Stair training;Functional mobility training    PT Goals (Current goals can be found in the Care Plan section)  Acute Rehab PT Goals Patient Stated Goal: return home PT Goal Formulation: With patient Time For Goal Achievement: 03/11/20 Potential to Achieve Goals: Good    Frequency 7X/week   Barriers to discharge        Co-evaluation               AM-PAC PT "6 Clicks" Mobility  Outcome Measure Help needed turning from your back to your side while in a flat bed without using bedrails?: None Help needed moving from lying on your back to sitting on the side of a flat bed without using bedrails?: A Little Help needed moving to and from a bed to a chair (including a wheelchair)?: A Little Help needed standing up from a chair using your arms (e.g., wheelchair or bedside chair)?: A Little Help needed to walk in hospital room?: A Little Help needed climbing 3-5 steps with a railing? : A Little 6 Click Score: 19    End of Session Equipment Utilized During Treatment: Gait belt Activity Tolerance: Patient tolerated treatment well Patient left: in bed;with call bell/phone within reach Nurse Communication: Mobility status PT Visit Diagnosis: Other abnormalities of gait and mobility (R26.89);Muscle weakness (generalized) (M62.81)    Time: 7209-4709 PT Time  Calculation (min) (ACUTE ONLY): 33 min   Charges:   PT Evaluation $PT Eval Moderate Complexity: 1 Mod PT Treatments $Therapeutic Exercise: 8-22 mins        Jason Mosley, PT Acute Rehab Services Pager [phone] [facility] [phone]    Jason Mosley 02/26/2020, 2:40 PM

## 2020-02-26 NOTE — Progress Notes (Signed)
AssistedDr. Ellender with right, ultrasound guided, adductor canal block. Side rails up, monitors on throughout procedure. See vital signs in flow sheet. Tolerated Procedure well.  

## 2020-02-26 NOTE — Anesthesia Preprocedure Evaluation (Signed)
Anesthesia Evaluation  Patient identified by MRN, date of birth, ID band Patient awake    Reviewed: Allergy & Precautions, NPO status , Patient's Chart, lab work & pertinent test results, reviewed documented beta blocker date and time   History of Anesthesia Complications (+) PONV  Airway Mallampati: II  TM Distance: >3 FB Neck ROM: Full    Dental no notable dental hx.    Pulmonary COPD, former smoker,    Pulmonary exam normal breath sounds clear to auscultation       Cardiovascular hypertension, Pt. on home beta blockers Normal cardiovascular exam Rhythm:Regular Rate:Normal     Neuro/Psych negative neurological ROS  negative psych ROS   GI/Hepatic negative GI ROS, Neg liver ROS,   Endo/Other  negative endocrine ROS  Renal/GU negative Renal ROS     Musculoskeletal  (+) Arthritis ,   Abdominal   Peds  Hematology HLD   Anesthesia Other Findings RIGHT KNEE DEGENERATIVE JOINT DISEASE  Reproductive/Obstetrics                             Anesthesia Physical Anesthesia Plan  ASA: II  Anesthesia Plan: Regional and Spinal   Post-op Pain Management:  Regional for Post-op pain   Induction: Intravenous  PONV Risk Score and Plan: 2 and Ondansetron, Dexamethasone, Propofol infusion and Treatment may vary due to age or medical condition  Airway Management Planned: Natural Airway  Additional Equipment:   Intra-op Plan:   Post-operative Plan:   Informed Consent: I have reviewed the patients History and Physical, chart, labs and discussed the procedure including the risks, benefits and alternatives for the proposed anesthesia with the patient or authorized representative who has indicated his/her understanding and acceptance.     Dental advisory given  Plan Discussed with: CRNA  Anesthesia Plan Comments:         Anesthesia Quick Evaluation

## 2020-02-26 NOTE — Anesthesia Procedure Notes (Signed)
Spinal  Patient location during procedure: OR Start time: 02/26/2020 9:55 AM End time: 02/26/2020 10:05 AM Staffing Performed: anesthesiologist  Anesthesiologist: Leonides Grills, MD Preanesthetic Checklist Completed: patient identified, IV checked, risks and benefits discussed, surgical consent, monitors and equipment checked, pre-op evaluation and timeout performed Spinal Block Patient position: sitting Prep: DuraPrep Patient monitoring: cardiac monitor, continuous pulse ox and blood pressure Approach: midline Location: L4-5 Injection technique: single-shot Needle Needle type: Pencan  Needle gauge: 24 G Needle length: 9 cm Assessment Sensory level: T10 Additional Notes Functioning IV was confirmed and monitors were applied. Sterile prep and drape, including hand hygiene and sterile gloves were used. The patient was positioned and the spine was prepped. The skin was anesthetized with lidocaine.  Free flow of clear CSF was obtained prior to injecting local anesthetic into the CSF.  The spinal needle aspirated freely following injection.  The needle was carefully withdrawn.  The patient tolerated the procedure well.

## 2020-02-26 NOTE — Anesthesia Procedure Notes (Signed)
Anesthesia Regional Block: Adductor canal block   Pre-Anesthetic Checklist: ,, timeout performed, Correct Patient, Correct Site, Correct Laterality, Correct Procedure,, site marked, risks and benefits discussed, Surgical consent,  Pre-op evaluation,  At surgeon's request and post-op pain management  Laterality: Right  Prep: chloraprep       Needles:  Injection technique: Single-shot  Needle Type: Echogenic Stimulator Needle     Needle Length: 10cm  Needle Gauge: 20     Additional Needles:   Procedures:,,,, ultrasound used (permanent image in chart),,,,  Narrative:  Start time: 02/26/2020 8:50 AM End time: 02/26/2020 9:00 AM Injection made incrementally with aspirations every 5 mL.  Performed by: Personally  Anesthesiologist: Leonides Grills, MD  Additional Notes: Functioning IV was confirmed and monitors were applied. A time-out was performed. Hand hygiene and sterile gloves were used. The thigh was placed in a frog-leg position and prepped in a sterile fashion. A 20ga BBraun echogenic stimulator needle was placed using ultrasound guidance.  Negative aspiration and negative test dose prior to incremental administration of local anesthetic. The patient tolerated the procedure well.

## 2020-02-26 NOTE — Interval H&P Note (Signed)
History and Physical Interval Note:  02/26/2020 9:09 AM  Jason Mosley  has presented today for surgery, with the diagnosis of RIGHT KNEE DEGENERATIVE JOINT DISEASE.  The various methods of treatment have been discussed with the patient and family. After consideration of risks, benefits and other options for treatment, the patient has consented to  Procedure(s): RIGHT TOTAL KNEE ARTHROPLASTY (Right) as a surgical intervention.  The patient's history has been reviewed, patient examined, no change in status, stable for surgery.  I have reviewed the patient's chart and labs.  Questions were answered to the patient's satisfaction.     Velna Ochs

## 2020-02-26 NOTE — Anesthesia Postprocedure Evaluation (Signed)
Anesthesia Post Note  Patient: Franklyn Cafaro  Procedure(s) Performed: RIGHT TOTAL KNEE ARTHROPLASTY (Right Knee)     Patient location during evaluation: PACU Anesthesia Type: Regional and Spinal Level of consciousness: awake Pain management: pain level controlled Vital Signs Assessment: post-procedure vital signs reviewed and stable Respiratory status: spontaneous breathing, respiratory function stable and patient connected to nasal cannula oxygen Cardiovascular status: blood pressure returned to baseline and stable Postop Assessment: no headache, no backache and no apparent nausea or vomiting Anesthetic complications: no   No complications documented.  Last Vitals:  Vitals:   02/26/20 1300 02/26/20 1400  BP: 123/79   Pulse: 61 (!) 58  Resp:    Temp:    SpO2: 100% 99%    Last Pain:  Vitals:   02/26/20 1249  TempSrc: Oral  PainSc: 0-No pain                 Decklin Weddington P Camara Renstrom

## 2020-02-27 ENCOUNTER — Encounter (HOSPITAL_COMMUNITY): Payer: Self-pay | Admitting: Orthopaedic Surgery

## 2021-12-15 IMAGING — DX DG CHEST 2V
2 series · 2 of 2 positions shown · non-contrast
Comparison: 06/07/2018.

CLINICAL DATA: Preoperative study.

EXAM:
CHEST - 2 VIEW

[chest pa]
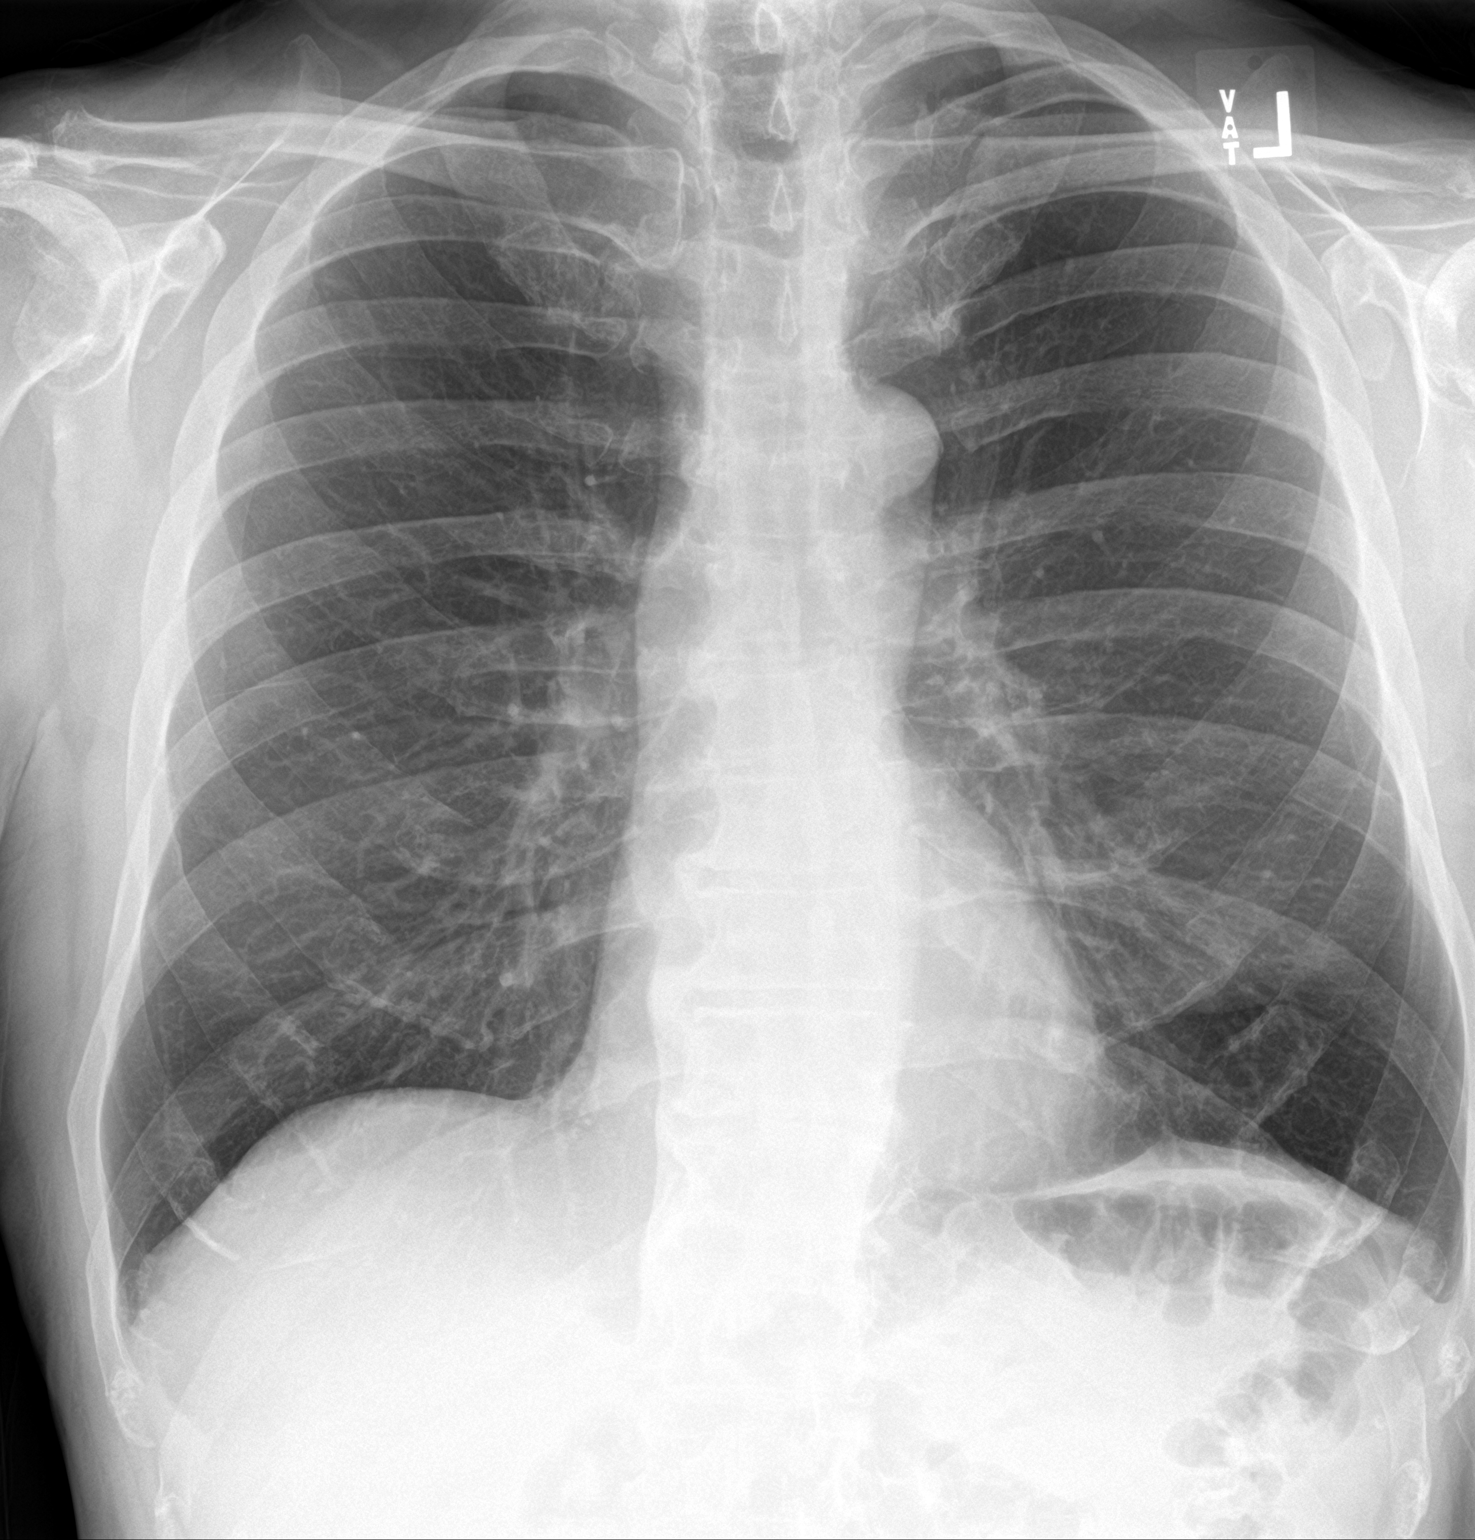

[chest lat]
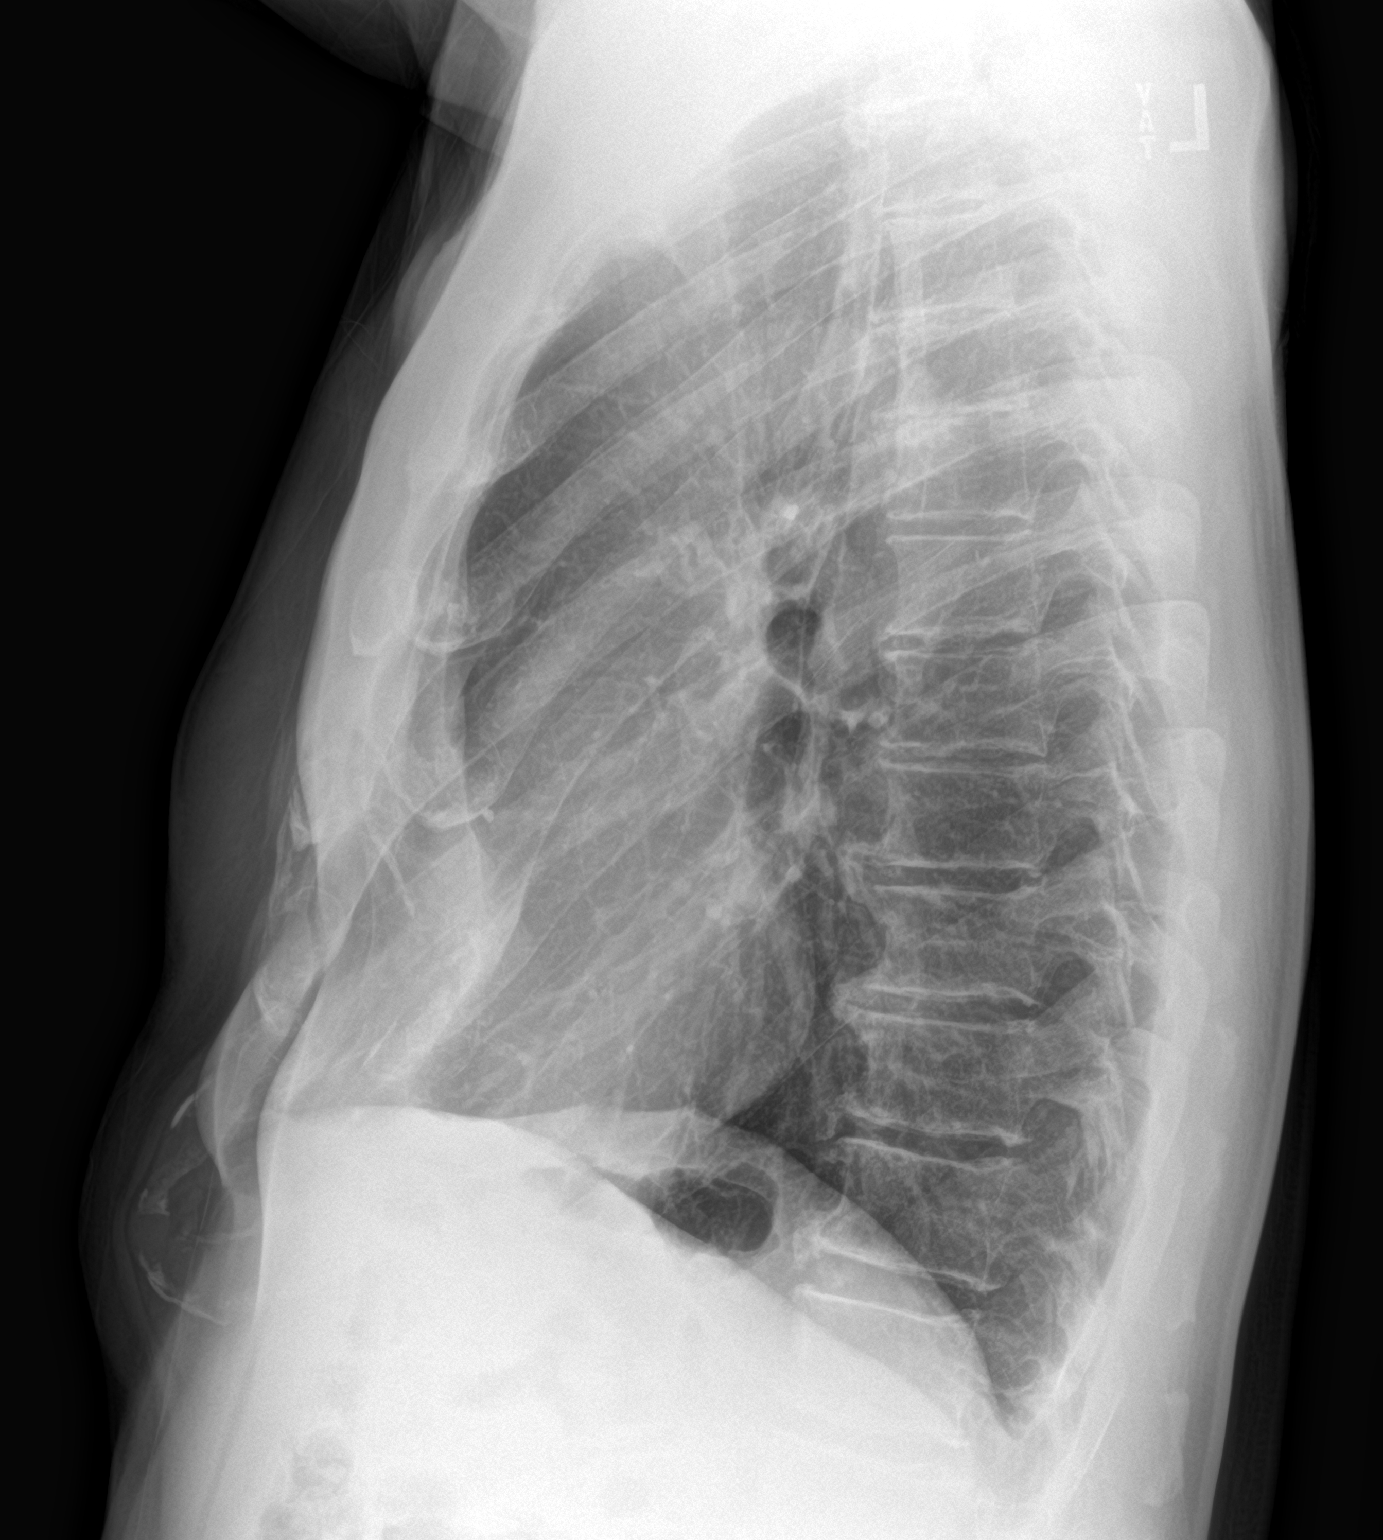

[2 of 2 positions shown; findings below may reference images not displayed]

FINDINGS: Mediastinum and hilar structures normal. Heart size normal. Mild
left base subsegmental atelectasis. No focal alveolar infiltrate.
Minimal left base pleural thickening consistent with scarring or
minimal left pleural effusion. Pectus deformity. Diffuse
degenerative change thoracic spine.
IMPRESSION: Mild left base subsegmental atelectasis. Minimal left base pleural
thickening consistent with scarring or minimal left pleural
effusion.

## 2022-08-13 IMAGING — CR DG CHEST 2V
2 series · 2 of 2 positions shown · non-contrast
Comparison: June 26, 2019.

CLINICAL DATA: Preop for right total knee replacement.

EXAM:
CHEST - 2 VIEW

[w chest pa]
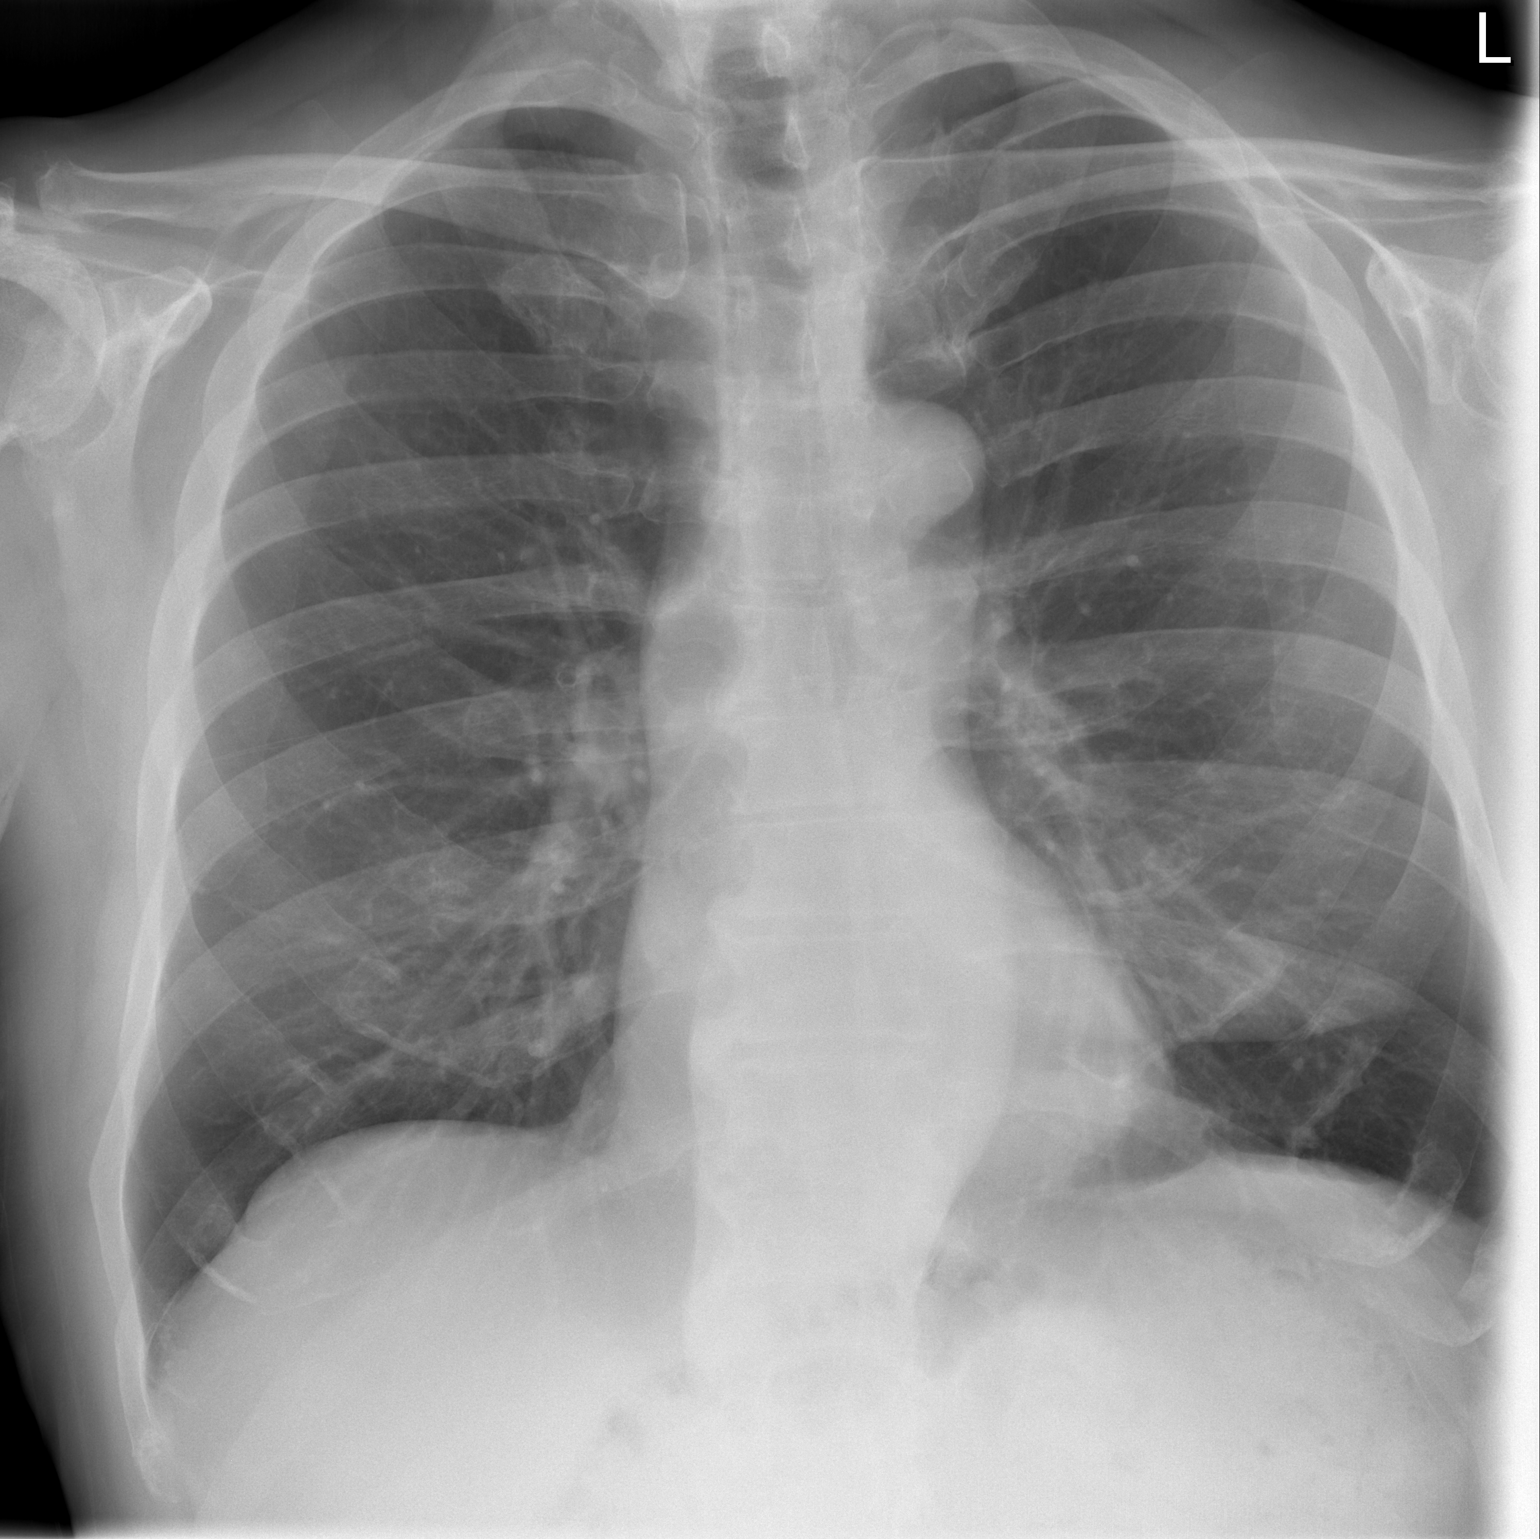

[w chest lat]
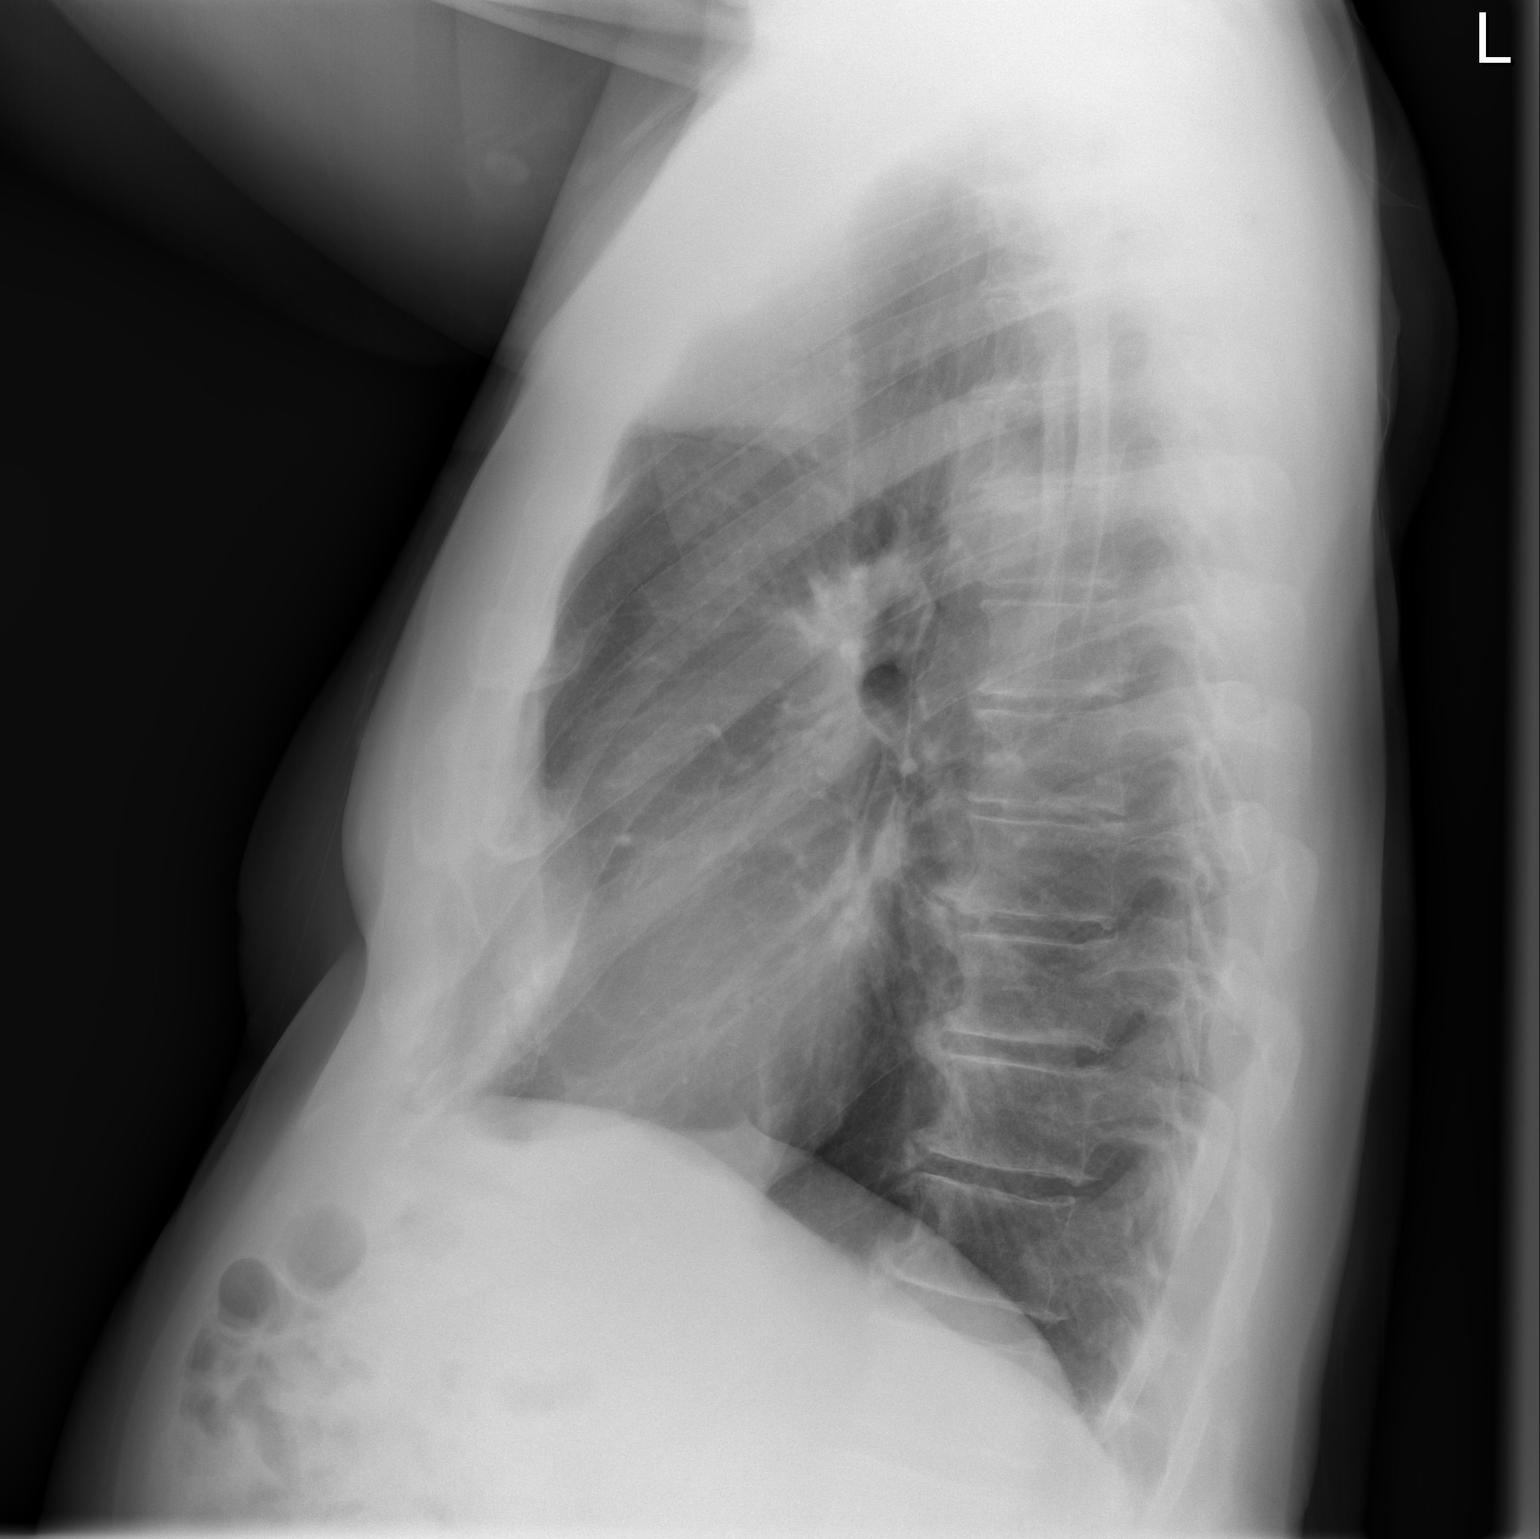

[2 of 2 positions shown; findings below may reference images not displayed]

FINDINGS: The heart size and mediastinal contours are within normal limits.
Both lungs are clear. The visualized skeletal structures are
unremarkable.
IMPRESSION: No active cardiopulmonary disease.
